# Patient Record
Sex: Female | Born: 1960 | Race: Black or African American | Hispanic: No | Marital: Single | Smoking: Former smoker
Health system: Southern US, Community
[De-identification: ages and names within clinical notes are randomized; demographics above are authoritative.]

## PROBLEM LIST (undated history)

## (undated) DIAGNOSIS — Z803 Family history of malignant neoplasm of breast: Secondary | ICD-10-CM

## (undated) DIAGNOSIS — Z800 Family history of malignant neoplasm of digestive organs: Secondary | ICD-10-CM

## (undated) DIAGNOSIS — K649 Unspecified hemorrhoids: Secondary | ICD-10-CM

## (undated) DIAGNOSIS — Z9221 Personal history of antineoplastic chemotherapy: Secondary | ICD-10-CM

## (undated) DIAGNOSIS — Z862 Personal history of diseases of the blood and blood-forming organs and certain disorders involving the immune mechanism: Secondary | ICD-10-CM

## (undated) DIAGNOSIS — C189 Malignant neoplasm of colon, unspecified: Secondary | ICD-10-CM

## (undated) HISTORY — PX: TUBAL LIGATION: SHX77

## (undated) HISTORY — PX: COLON SURGERY: SHX602

## (undated) HISTORY — DX: Family history of malignant neoplasm of breast: Z80.3

## (undated) HISTORY — DX: Family history of malignant neoplasm of digestive organs: Z80.0

---

## 1995-05-09 HISTORY — PX: TUBAL LIGATION: SHX77

## 1999-07-26 ENCOUNTER — Encounter: Admission: RE | Admit: 1999-07-26 | Discharge: 1999-07-26 | Payer: Self-pay | Admitting: Obstetrics & Gynecology

## 1999-07-26 ENCOUNTER — Other Ambulatory Visit: Admission: RE | Admit: 1999-07-26 | Discharge: 1999-07-26 | Payer: Self-pay | Admitting: *Deleted

## 2000-03-13 ENCOUNTER — Ambulatory Visit (HOSPITAL_COMMUNITY): Admission: RE | Admit: 2000-03-13 | Discharge: 2000-03-13 | Payer: Self-pay | Admitting: Obstetrics & Gynecology

## 2000-11-30 ENCOUNTER — Emergency Department (HOSPITAL_COMMUNITY): Admission: EM | Admit: 2000-11-30 | Discharge: 2000-11-30 | Payer: Self-pay | Admitting: Emergency Medicine

## 2000-11-30 ENCOUNTER — Encounter: Payer: Self-pay | Admitting: Emergency Medicine

## 2004-02-12 ENCOUNTER — Ambulatory Visit: Payer: Self-pay | Admitting: Family Medicine

## 2009-12-06 DIAGNOSIS — Z85038 Personal history of other malignant neoplasm of large intestine: Secondary | ICD-10-CM

## 2009-12-06 HISTORY — DX: Personal history of other malignant neoplasm of large intestine: Z85.038

## 2009-12-07 ENCOUNTER — Ambulatory Visit: Payer: Self-pay | Admitting: Cardiology

## 2009-12-07 ENCOUNTER — Ambulatory Visit (HOSPITAL_COMMUNITY): Admission: RE | Admit: 2009-12-07 | Discharge: 2009-12-07 | Payer: Self-pay | Admitting: Cardiology

## 2009-12-07 ENCOUNTER — Encounter: Admission: RE | Admit: 2009-12-07 | Discharge: 2009-12-07 | Payer: Self-pay | Admitting: Cardiology

## 2009-12-10 ENCOUNTER — Ambulatory Visit: Payer: Self-pay | Admitting: Internal Medicine

## 2009-12-10 ENCOUNTER — Ambulatory Visit: Payer: Self-pay | Admitting: Cardiology

## 2009-12-10 ENCOUNTER — Inpatient Hospital Stay (HOSPITAL_COMMUNITY): Admission: EM | Admit: 2009-12-10 | Discharge: 2009-12-20 | Payer: Self-pay | Admitting: Emergency Medicine

## 2009-12-13 ENCOUNTER — Encounter: Payer: Self-pay | Admitting: Internal Medicine

## 2009-12-14 ENCOUNTER — Encounter: Payer: Self-pay | Admitting: Internal Medicine

## 2009-12-14 HISTORY — PX: COLON SURGERY: SHX602

## 2009-12-15 ENCOUNTER — Ambulatory Visit: Payer: Self-pay | Admitting: Hematology and Oncology

## 2009-12-27 ENCOUNTER — Encounter: Payer: Self-pay | Admitting: Internal Medicine

## 2009-12-27 ENCOUNTER — Ambulatory Visit: Payer: Self-pay | Admitting: Internal Medicine

## 2009-12-27 DIAGNOSIS — C189 Malignant neoplasm of colon, unspecified: Secondary | ICD-10-CM

## 2009-12-27 DIAGNOSIS — D509 Iron deficiency anemia, unspecified: Secondary | ICD-10-CM

## 2009-12-29 LAB — CONVERTED CEMR LAB
HCT: 33.9 % — ABNORMAL LOW (ref 36.0–46.0)
Hemoglobin: 8.8 g/dL — ABNORMAL LOW (ref 12.0–15.0)
LDL Cholesterol: 104 mg/dL — ABNORMAL HIGH (ref 0–99)
MCV: 77.4 fL — ABNORMAL LOW (ref >=78.0)
Platelets: 1527 10*3/uL (ref 150–400)
RBC: 4.38 M/uL (ref 3.87–5.11)
RDW: 32.5 % — ABNORMAL HIGH (ref 11.5–15.5)
Total CHOL/HDL Ratio: 3.6
Triglycerides: 82 mg/dL (ref <150)
VLDL: 16 mg/dL (ref 0–40)

## 2010-01-03 ENCOUNTER — Ambulatory Visit (HOSPITAL_COMMUNITY): Admission: RE | Admit: 2010-01-03 | Discharge: 2010-01-03 | Payer: Self-pay | Admitting: Hematology and Oncology

## 2010-01-03 LAB — CBC WITH DIFFERENTIAL/PLATELET
BASO%: 0.3 % (ref 0.0–2.0)
EOS%: 0.9 % (ref 0.0–7.0)
LYMPH%: 26.4 % (ref 14.0–49.7)
MCH: 20.7 pg — ABNORMAL LOW (ref 25.1–34.0)
NEUT%: 64.8 % (ref 38.4–76.8)
RDW: 29.6 % — ABNORMAL HIGH (ref 11.2–14.5)
lymph#: 3.1 10*3/uL (ref 0.9–3.3)
nRBC: 0 % (ref 0–0)

## 2010-01-03 LAB — COMPREHENSIVE METABOLIC PANEL
ALT: 10 U/L (ref 0–35)
AST: 15 U/L (ref 0–37)
Alkaline Phosphatase: 40 U/L (ref 39–117)
Chloride: 105 mEq/L (ref 96–112)
Creatinine, Ser: 0.46 mg/dL (ref 0.40–1.20)
Potassium: 4.2 mEq/L (ref 3.5–5.3)
Total Protein: 7.5 g/dL (ref 6.0–8.3)

## 2010-01-03 LAB — CEA: CEA: 12.6 ng/mL — ABNORMAL HIGH (ref 0.0–5.0)

## 2010-01-05 ENCOUNTER — Encounter: Admission: RE | Admit: 2010-01-05 | Discharge: 2010-01-05 | Payer: Self-pay | Admitting: Hematology and Oncology

## 2010-01-05 LAB — IRON AND TIBC: UIBC: 268 ug/dL

## 2010-01-05 LAB — FERRITIN: Ferritin: 23 ng/mL (ref 10–291)

## 2010-01-06 ENCOUNTER — Ambulatory Visit (HOSPITAL_COMMUNITY): Admission: RE | Admit: 2010-01-06 | Discharge: 2010-01-06 | Payer: Self-pay | Admitting: Hematology and Oncology

## 2010-01-13 ENCOUNTER — Ambulatory Visit (HOSPITAL_BASED_OUTPATIENT_CLINIC_OR_DEPARTMENT_OTHER): Admission: RE | Admit: 2010-01-13 | Discharge: 2010-01-13 | Payer: Self-pay | Admitting: Surgery

## 2010-01-13 HISTORY — PX: PORTACATH PLACEMENT: SHX2246

## 2010-01-17 ENCOUNTER — Ambulatory Visit: Payer: Self-pay | Admitting: Hematology and Oncology

## 2010-02-01 LAB — CBC WITH DIFFERENTIAL/PLATELET
BASO%: 0.3 % (ref 0.0–2.0)
Basophils Absolute: 0 10*3/uL (ref 0.0–0.1)
HGB: 9.8 g/dL — ABNORMAL LOW (ref 11.6–15.9)
LYMPH%: 24.4 % (ref 14.0–49.7)
MONO#: 0.5 10*3/uL (ref 0.1–0.9)
NEUT%: 69.8 % (ref 38.4–76.8)
RDW: 24 % — ABNORMAL HIGH (ref 11.2–14.5)
lymph#: 2.7 10*3/uL (ref 0.9–3.3)
nRBC: 0 % (ref 0–0)

## 2010-02-01 LAB — COMPREHENSIVE METABOLIC PANEL
ALT: 8 U/L (ref 0–35)
Albumin: 3.7 g/dL (ref 3.5–5.2)
Alkaline Phosphatase: 56 U/L (ref 39–117)
BUN: 11 mg/dL (ref 6–23)
Calcium: 9.4 mg/dL (ref 8.4–10.5)
Chloride: 102 mEq/L (ref 96–112)
Creatinine, Ser: 0.64 mg/dL (ref 0.40–1.20)
Glucose, Bld: 86 mg/dL (ref 70–99)
Potassium: 4.4 mEq/L (ref 3.5–5.3)
Total Bilirubin: 0.2 mg/dL — ABNORMAL LOW (ref 0.3–1.2)
Total Protein: 6.9 g/dL (ref 6.0–8.3)

## 2010-02-15 LAB — CBC WITH DIFFERENTIAL/PLATELET
Eosinophils Absolute: 0.1 10*3/uL (ref 0.0–0.5)
HCT: 36.1 % (ref 34.8–46.6)
LYMPH%: 29.8 % (ref 14.0–49.7)
MCH: 23.2 pg — ABNORMAL LOW (ref 25.1–34.0)
MCHC: 29.6 g/dL — ABNORMAL LOW (ref 31.5–36.0)
MCV: 78.3 fL — ABNORMAL LOW (ref 79.5–101.0)
MONO#: 0.6 10*3/uL (ref 0.1–0.9)
MONO%: 6.2 % (ref 0.0–14.0)
NEUT%: 62.4 % (ref 38.4–76.8)
Platelets: 379 10*3/uL (ref 145–400)
RBC: 4.61 10*6/uL (ref 3.70–5.45)
RDW: 22.2 % — ABNORMAL HIGH (ref 11.2–14.5)
lymph#: 2.9 10*3/uL (ref 0.9–3.3)

## 2010-02-15 LAB — COMPREHENSIVE METABOLIC PANEL
ALT: <8 U/L (ref 0–35)
Albumin: 4 g/dL (ref 3.5–5.2)
Alkaline Phosphatase: 109 U/L (ref 39–117)
Calcium: 9.4 mg/dL (ref 8.4–10.5)
Glucose, Bld: 111 mg/dL — ABNORMAL HIGH (ref 70–99)
Sodium: 139 mEq/L (ref 135–145)
Total Bilirubin: 0.4 mg/dL (ref 0.3–1.2)
Total Protein: 7.1 g/dL (ref 6.0–8.3)

## 2010-02-16 ENCOUNTER — Ambulatory Visit: Payer: Self-pay | Admitting: Hematology and Oncology

## 2010-03-01 LAB — COMPREHENSIVE METABOLIC PANEL
AST: 14 U/L (ref 0–37)
Creatinine, Ser: 0.42 mg/dL (ref 0.40–1.20)
Glucose, Bld: 103 mg/dL — ABNORMAL HIGH (ref 70–99)
Sodium: 138 mEq/L (ref 135–145)
Total Bilirubin: 0.4 mg/dL (ref 0.3–1.2)
Total Protein: 7 g/dL (ref 6.0–8.3)

## 2010-03-01 LAB — CBC WITH DIFFERENTIAL/PLATELET
Eosinophils Absolute: 0.1 10*3/uL (ref 0.0–0.5)
HCT: 35.5 % (ref 34.8–46.6)
HGB: 10.8 g/dL — ABNORMAL LOW (ref 11.6–15.9)
MCV: 78.4 fL — ABNORMAL LOW (ref 79.5–101.0)
MONO%: 11.9 % (ref 0.0–14.0)
NEUT%: 59.2 % (ref 38.4–76.8)
Platelets: 552 10*3/uL — ABNORMAL HIGH (ref 145–400)
RBC: 4.53 10*6/uL (ref 3.70–5.45)
RDW: 21.8 % — ABNORMAL HIGH (ref 11.2–14.5)
WBC: 10.8 10*3/uL — ABNORMAL HIGH (ref 3.9–10.3)
lymph#: 3 10*3/uL (ref 0.9–3.3)

## 2010-03-16 LAB — COMPREHENSIVE METABOLIC PANEL
Albumin: 4 g/dL (ref 3.5–5.2)
Creatinine, Ser: 0.61 mg/dL (ref 0.40–1.20)

## 2010-03-16 LAB — CBC WITH DIFFERENTIAL/PLATELET
Basophils Absolute: 0.1 10*3/uL (ref 0.0–0.1)
Eosinophils Absolute: 0.1 10*3/uL (ref 0.0–0.5)
HCT: 36.7 % (ref 34.8–46.6)
MCH: 24.4 pg — ABNORMAL LOW (ref 25.1–34.0)
MCHC: 31.1 g/dL — ABNORMAL LOW (ref 31.5–36.0)
MONO#: 2.3 10*3/uL — ABNORMAL HIGH (ref 0.1–0.9)
MONO%: 17.7 % — ABNORMAL HIGH (ref 0.0–14.0)
Platelets: 400 10*3/uL (ref 145–400)
WBC: 12.8 10*3/uL — ABNORMAL HIGH (ref 3.9–10.3)
lymph#: 3 10*3/uL (ref 0.9–3.3)
nRBC: 0 % (ref 0–0)

## 2010-03-18 ENCOUNTER — Ambulatory Visit: Payer: Self-pay | Admitting: Hematology and Oncology

## 2010-03-29 LAB — CBC WITH DIFFERENTIAL/PLATELET
Basophils Absolute: 0.1 10*3/uL (ref 0.0–0.1)
Eosinophils Absolute: 0.2 10*3/uL (ref 0.0–0.5)
HGB: 10.9 g/dL — ABNORMAL LOW (ref 11.6–15.9)
MONO#: 1.4 10*3/uL — ABNORMAL HIGH (ref 0.1–0.9)
NEUT#: 5.6 10*3/uL (ref 1.5–6.5)
NEUT%: 50 % (ref 38.4–76.8)
Platelets: 414 10*3/uL — ABNORMAL HIGH (ref 145–400)
RDW: 21.2 % — ABNORMAL HIGH (ref 11.2–14.5)
WBC: 11.2 10*3/uL — ABNORMAL HIGH (ref 3.9–10.3)
lymph#: 4 10*3/uL — ABNORMAL HIGH (ref 0.9–3.3)

## 2010-03-29 LAB — COMPREHENSIVE METABOLIC PANEL
Albumin: 4 g/dL (ref 3.5–5.2)
Calcium: 9.4 mg/dL (ref 8.4–10.5)
Creatinine, Ser: 0.5 mg/dL (ref 0.40–1.20)
Total Protein: 6.8 g/dL (ref 6.0–8.3)

## 2010-04-12 LAB — CBC WITH DIFFERENTIAL/PLATELET
HCT: 30.6 % — ABNORMAL LOW (ref 34.8–46.6)
HGB: 9.8 g/dL — ABNORMAL LOW (ref 11.6–15.9)
LYMPH%: 43.9 % (ref 14.0–49.7)
MCHC: 32 g/dL (ref 31.5–36.0)
MONO#: 1.4 10*3/uL — ABNORMAL HIGH (ref 0.1–0.9)
RBC: 3.98 10*6/uL (ref 3.70–5.45)
RDW: 20.3 % — ABNORMAL HIGH (ref 11.2–14.5)
WBC: 4.9 10*3/uL (ref 3.9–10.3)
lymph#: 2.2 10*3/uL (ref 0.9–3.3)

## 2010-04-12 LAB — COMPREHENSIVE METABOLIC PANEL
ALT: 9 U/L (ref 0–35)
Albumin: 3.9 g/dL (ref 3.5–5.2)
Alkaline Phosphatase: 68 U/L (ref 39–117)
Calcium: 9.3 mg/dL (ref 8.4–10.5)
Chloride: 105 mEq/L (ref 96–112)
Creatinine, Ser: 0.41 mg/dL (ref 0.40–1.20)
Glucose, Bld: 88 mg/dL (ref 70–99)
Total Bilirubin: 0.9 mg/dL (ref 0.3–1.2)
Total Protein: 6.9 g/dL (ref 6.0–8.3)

## 2010-04-18 ENCOUNTER — Ambulatory Visit: Payer: Self-pay | Admitting: Hematology and Oncology

## 2010-04-20 LAB — CBC WITH DIFFERENTIAL/PLATELET
BASO%: 1.6 % (ref 0.0–2.0)
Basophils Absolute: 0.1 10*3/uL (ref 0.0–0.1)
EOS%: 3.4 % (ref 0.0–7.0)
HCT: 31.2 % — ABNORMAL LOW (ref 34.8–46.6)
LYMPH%: 49.1 % (ref 14.0–49.7)
MCHC: 31.1 g/dL — ABNORMAL LOW (ref 31.5–36.0)
MCV: 77.2 fL — ABNORMAL LOW (ref 79.5–101.0)
MONO#: 1.5 10*3/uL — ABNORMAL HIGH (ref 0.1–0.9)
MONO%: 26.5 % — ABNORMAL HIGH (ref 0.0–14.0)
NEUT#: 1.1 10*3/uL — ABNORMAL LOW (ref 1.5–6.5)
NEUT%: 19.4 % — ABNORMAL LOW (ref 38.4–76.8)
Platelets: 412 10*3/uL — ABNORMAL HIGH (ref 145–400)
RDW: 19.6 % — ABNORMAL HIGH (ref 11.2–14.5)
nRBC: 0 % (ref 0–0)

## 2010-04-21 LAB — CBC WITH DIFFERENTIAL/PLATELET
BASO%: 1.4 % (ref 0.0–2.0)
Eosinophils Absolute: 0.2 10*3/uL (ref 0.0–0.5)
HCT: 31.3 % — ABNORMAL LOW (ref 34.8–46.6)
MCH: 23.8 pg — ABNORMAL LOW (ref 25.1–34.0)
MCHC: 31.3 g/dL — ABNORMAL LOW (ref 31.5–36.0)
MONO#: 1.6 10*3/uL — ABNORMAL HIGH (ref 0.1–0.9)
MONO%: 27.9 % — ABNORMAL HIGH (ref 0.0–14.0)
NEUT#: 1 10*3/uL — ABNORMAL LOW (ref 1.5–6.5)
Platelets: 488 10*3/uL — ABNORMAL HIGH (ref 145–400)
RBC: 4.11 10*6/uL (ref 3.70–5.45)

## 2010-04-28 LAB — COMPREHENSIVE METABOLIC PANEL
ALT: 16 U/L (ref 0–35)
AST: 38 U/L — ABNORMAL HIGH (ref 0–37)
Albumin: 4 g/dL (ref 3.5–5.2)
Alkaline Phosphatase: 338 U/L — ABNORMAL HIGH (ref 39–117)
Chloride: 105 mEq/L (ref 96–112)
Potassium: 3.9 mEq/L (ref 3.5–5.3)
Sodium: 139 mEq/L (ref 135–145)
Total Bilirubin: 0.3 mg/dL (ref 0.3–1.2)
Total Protein: 7.4 g/dL (ref 6.0–8.3)

## 2010-04-28 LAB — CBC WITH DIFFERENTIAL/PLATELET
BASO%: 0.1 % (ref 0.0–2.0)
EOS%: 0.3 % (ref 0.0–7.0)
LYMPH%: 8.5 % — ABNORMAL LOW (ref 14.0–49.7)
MCV: 78.9 fL — ABNORMAL LOW (ref 79.5–101.0)
MONO%: 2.7 % (ref 0.0–14.0)
NEUT%: 88.4 % — ABNORMAL HIGH (ref 38.4–76.8)
RDW: 20.3 % — ABNORMAL HIGH (ref 11.2–14.5)
WBC: 67.5 10*3/uL (ref 3.9–10.3)

## 2010-05-10 LAB — CBC WITH DIFFERENTIAL/PLATELET
EOS%: 2 % (ref 0.0–7.0)
Eosinophils Absolute: 0.2 10*3/uL (ref 0.0–0.5)
HCT: 32.8 % — ABNORMAL LOW (ref 34.8–46.6)
MONO#: 1.2 10*3/uL — ABNORMAL HIGH (ref 0.1–0.9)
NEUT#: 4.2 10*3/uL (ref 1.5–6.5)
NEUT%: 52.7 % (ref 38.4–76.8)
nRBC: 1 % — ABNORMAL HIGH (ref 0–0)

## 2010-05-20 ENCOUNTER — Ambulatory Visit (HOSPITAL_BASED_OUTPATIENT_CLINIC_OR_DEPARTMENT_OTHER): Payer: BC Managed Care – PPO | Admitting: Hematology and Oncology

## 2010-05-24 LAB — MANUAL DIFFERENTIAL
ALC: 3.3 10*3/uL (ref 0.9–3.3)
ANC (CHCC manual diff): 3.7 10*3/uL (ref 1.5–6.5)
Band Neutrophils: 2 % (ref 0–10)
Basophil: 2 % (ref 0–2)
Blasts: 0 % (ref 0–0)
EOS: 1 % (ref 0–7)
LYMPH: 37 % (ref 14–49)
MONO: 19 % — ABNORMAL HIGH (ref 0–14)
Metamyelocytes: 0 % (ref 0–0)
Myelocytes: 0 % (ref 0–0)
Other Cell: 0 % (ref 0–0)
PLT EST: ADEQUATE
PROMYELO: 0 % (ref 0–0)
SEG: 39 % (ref 38–77)
Variant Lymph: 0 % (ref 0–0)
nRBC: 2 % — ABNORMAL HIGH (ref 0–0)

## 2010-05-24 LAB — COMPREHENSIVE METABOLIC PANEL WITH GFR
ALT: 12 U/L (ref 0–35)
AST: 22 U/L (ref 0–37)
Albumin: 4.1 g/dL (ref 3.5–5.2)
Alkaline Phosphatase: 158 U/L — ABNORMAL HIGH (ref 39–117)
BUN: 10 mg/dL (ref 6–23)
CO2: 26 meq/L (ref 19–32)
Calcium: 9.9 mg/dL (ref 8.4–10.5)
Chloride: 102 meq/L (ref 96–112)
Creatinine, Ser: 0.52 mg/dL (ref 0.40–1.20)
Glucose, Bld: 131 mg/dL — ABNORMAL HIGH (ref 70–99)
Potassium: 4.5 meq/L (ref 3.5–5.3)
Sodium: 138 meq/L (ref 135–145)
Total Bilirubin: 0.6 mg/dL (ref 0.3–1.2)
Total Protein: 7.4 g/dL (ref 6.0–8.3)

## 2010-05-24 LAB — CBC WITH DIFFERENTIAL/PLATELET
HCT: 34.7 % — ABNORMAL LOW (ref 34.8–46.6)
HGB: 10.8 g/dL — ABNORMAL LOW (ref 11.6–15.9)
MCH: 23.9 pg — ABNORMAL LOW (ref 25.1–34.0)
MCHC: 31.1 g/dL — ABNORMAL LOW (ref 31.5–36.0)
MCV: 76.9 fL — ABNORMAL LOW (ref 79.5–101.0)
Platelets: 456 10*3/uL — ABNORMAL HIGH (ref 145–400)
RBC: 4.51 10*6/uL (ref 3.70–5.45)
RDW: 22.7 % — ABNORMAL HIGH (ref 11.2–14.5)
WBC: 8.9 10*3/uL (ref 3.9–10.3)

## 2010-05-29 ENCOUNTER — Encounter: Payer: Self-pay | Admitting: Obstetrics and Gynecology

## 2010-06-07 DIAGNOSIS — R112 Nausea with vomiting, unspecified: Secondary | ICD-10-CM

## 2010-06-07 DIAGNOSIS — C189 Malignant neoplasm of colon, unspecified: Secondary | ICD-10-CM

## 2010-06-07 DIAGNOSIS — Z7901 Long term (current) use of anticoagulants: Secondary | ICD-10-CM

## 2010-06-07 LAB — CBC WITH DIFFERENTIAL/PLATELET
BASO%: 0.4 % (ref 0.0–2.0)
Basophils Absolute: 0.1 10*3/uL (ref 0.0–0.1)
HCT: 33.5 % — ABNORMAL LOW (ref 34.8–46.6)
HGB: 10.6 g/dL — ABNORMAL LOW (ref 11.6–15.9)
MCH: 24.4 pg — ABNORMAL LOW (ref 25.1–34.0)
MCHC: 31.6 g/dL (ref 31.5–36.0)
MONO#: 2.6 10*3/uL — ABNORMAL HIGH (ref 0.1–0.9)
MONO%: 21.2 % — ABNORMAL HIGH (ref 0.0–14.0)
NEUT#: 6.9 10*3/uL — ABNORMAL HIGH (ref 1.5–6.5)
RDW: 24.6 % — ABNORMAL HIGH (ref 11.2–14.5)
WBC: 12.2 10*3/uL — ABNORMAL HIGH (ref 3.9–10.3)
lymph#: 2.5 10*3/uL (ref 0.9–3.3)
nRBC: 1 % — ABNORMAL HIGH (ref 0–0)

## 2010-06-07 LAB — COMPREHENSIVE METABOLIC PANEL
ALT: 10 U/L (ref 0–35)
Alkaline Phosphatase: 158 U/L — ABNORMAL HIGH (ref 39–117)
CO2: 22 mEq/L (ref 19–32)
Potassium: 4 mEq/L (ref 3.5–5.3)
Sodium: 138 mEq/L (ref 135–145)

## 2010-06-07 NOTE — Consult Note (Signed)
Summary: REPORT OF SURGICAL   REPORT OF SURGICAL   Imported By: Margie Billet 01/05/2010 13:53:44  _____________________________________________________________________  External Attachment:    Type:   Image     Comment:   External Document

## 2010-06-07 NOTE — Letter (Signed)
Summary: [employer]  [employer]   Imported By: Margie Billet 01/26/2010 16:45:17  _____________________________________________________________________  External Attachment:    Type:   Image     Comment:   External Document

## 2010-06-07 NOTE — Assessment & Plan Note (Signed)
Summary: HFU/ESTABLISH AS NEW PT/PER MED STUDENT BRIANNA BUCKNER/DS   Vital Signs:  Patient profile:   50 year old female Height:      66 inches (167.64 cm) Weight:      111.4 pounds (50.64 kg) BMI:     18.05 Temp:     96.6 degrees F (35.89 degrees C) oral Pulse rate:   83 / minute BP sitting:   110 / 75  (left arm) Cuff size:   small  Vitals Entered By: Theotis Barrio NT II (December 27, 2009 8:44 AM) CC: PATIENT IS HERE FOR HOSPT FOLLOW UP APPT /  ABD PAIN   /  Is Patient Diabetic? No Pain Assessment Patient in pain? yes     Location: abdomen Intensity:     7 Type: sharp Onset of pain  SURGERY  /  AUGUST 011 Nutritional Status BMI of < 19 = underweight  Have you ever been in a relationship where you felt threatened, hurt or afraid?No   Does patient need assistance? Functional Status Self care Ambulation Normal Comments HOSPITAL FOLLOW UP APPT   CC:  PATIENT IS HERE FOR HOSPT FOLLOW UP APPT /  ABD PAIN   / .  History of Present Illness: Patient is here for hospital follow-up after surgical resection of colon cancer and severe iron-deficiency anemia.    Patient has follow-up appointments with: Oncology - Dr. Dorna Bloom Aug 26. Surgery - Dr. Gerrit Friends Aug 24 at 1215p  Pt has had bowel movements, no blood in her stool. No trouble taking iron. No other complaints.   No CP, SOB, dizziness, weakness.   Preventive Screening-Counseling & Management  Alcohol-Tobacco     Smoking Status: quit     Year Quit: 1997  Caffeine-Diet-Exercise     Does Patient Exercise: yes     Type of exercise: WALKING     Exercise (avg: min/session): 30-60     Times/week:     7  Current Problems (verified): 1)  Adenocarcinoma, Colon  (ICD-153.9) 2)  Preventive Health Care  (ICD-V70.0) 3)  Anemia, Iron Deficiency  (ICD-280.9)  Current Medications (verified): 1)  Ferrous Sulfate 325 (65 Fe) Mg Tabs (Ferrous Sulfate) .... Take 1 Tablet By Mouth Four Times A Day 2)  Hydrocodone-Acetaminophen 5-325  Mg Tabs (Hydrocodone-Acetaminophen) .... 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 BTL - 1997  Family History: mother - lung Ca father - unknown children - healthy No family history of colon cancer  Social History: Patient lives alone and with her 2 children (33 years old and 64 years old).  Patient is single. Sexually active with one partner, does not use condoms.   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 Former smoker - quit in 1997 Occasional alcohol (wine) No drugs.Smoking Status:  quit Does Patient Exercise:  yes  Review of Systems       see HPI  Physical Exam  General:  alert and NAD Mouth:  pharynx pink and moist.   Neck:  supple, full ROM, and no masses.   Lungs:  normal respiratory effort, normal breath sounds, no crackles, and no wheezes.   Heart:  normal rate, regular rhythm, and no murmur.   Abdomen:  soft  and normal bowel sounds.  TTP LUQ.  Pt with slightly tender midline incision with staples extending from epigastrum to approximately 4cm below navel.  No drainage, erythema.  Pulses:  2+ bilateral pedal pulses Extremities:  no edema Neurologic:  alert & oriented X3.  CN grossly intact, gait wnl. sensation to fine touch intact all 4 extremities Cervical Nodes:  no anterior cervical adenopathy.   Psych:  Oriented X3 and memory intact for recent and remote.  somewhat flat affect.   Impression & Recommendations:  Problem # 1:  ADENOCARCINOMA, COLON (ICD-153.9) Patient s/p resection of colon, spleen, and distal portion of pancreas on December 14, 2009.  Has f/u appts with surgery on Aug 24 for stable removal and  with oncology ([facility]) on aug 26.  Will await recommendations of oncology to see if further treatment for the patient's cancer will be considered.    Problem # 2:  ANEMIA, IRON DEFICIENCY (ICD-280.9) Pt is tolerating iron well.  On admission Hgb 3. Thought 2/2 blood loss from colon cancer.  Pt received 4uPRBCs during hospitalization.  On d/c Hgb 7.8.  Iron studies revealed negligible serum iron levels.  Too soon for recheck of serum iron or ferritin.  Reports no bleeding, no blood in stool.  Continue QID iron.  Check CBC today.  F/u in 3 months.  Her updated medication list for this problem includes:    Ferrous Sulfate 325 (65 Fe) Mg Tabs (Ferrous sulfate) .Marland Kitchen... 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  Received pneumovax and meningoccocal vaccine in hospital.  Is not certain if received tetanus vaccine.  Will administer tetanus today.  Pt had neg pap smear [facility] in July.  She had a mammogram scheduled for last week but missed that appointment.  She will need referral at her next visit for mammogram.  Pt is fasting today - will check FLP.  LFTs done in hospital were wnl.  Orders: T-Lipid Profile ([id number])  Complete Medication List: 1)  Ferrous Sulfate 325 (65 Fe) Mg Tabs (Ferrous sulfate) .... Take 1 tablet by mouth four times a day 2)  Hydrocodone-acetaminophen 5-325 Mg Tabs (Hydrocodone-acetaminophen) .... Take 1 tablet by mouth q6h as needed  Other Orders: Tdap => 46yrs IM ([id number]) Admin 1st Vaccine (13086)  Patient Instructions: 1)  Please finish your fluconazole- you have one more day of this medicine. 2)  Please take your iron as directed 3)  Please keep the appointments with your oncologist (cancer) and surgeon this week. 4)  Follow-up with me in 3 months, or sooner if needed. Process Orders Check  Orders Results:     [facility]: ABN not required for this insurance Tests Sent for requisitioning (December 27, 2009 11:42 AM):     12/27/2009: [facility] -- T-CBC No Diff [[id number]] (signed)     12/27/2009: [facility] -- T-Lipid Profile [phone] (signed)     Prevention & Chronic Care Immunizations   Influenza vaccine: Not documented    Tetanus booster: 12/27/2009: Tdap   Tetanus booster due: 12/28/2019    Pneumococcal vaccine: given at hospital after splenecomy, with meningococcal vaccine  (12/20/2009)   Pneumococcal vaccine deferral: Not indicated  (12/27/2009)   Pneumococcal vaccine due: 12/21/2014  Other Screening   Pap smear: Not documented   Pap smear action/deferral: Deferred  (12/27/2009)  Pap smear due: 12/28/2010    Mammogram: Not documented   Mammogram action/deferral: Deferred  (12/27/2009)   Smoking status: quit  (12/27/2009)  Lipids   Total Cholesterol: Not documented   LDL: Not documented   LDL Direct: Not documented   HDL: Not documented   Triglycerides: Not documented   Nursing Instructions: Give tetanus booster today..DTap vaccine given.Cynda Familia [facility])  December 27, 2009 9:58 AM     Immunization History:  Meningococcal Immunization History:    Meningococcal:  inpatient (12/20/2009)  Immunizations Administered:  Tetanus Vaccine:    Vaccine Type: Tdap    Site: left deltoid    Mfr: GlaxoSmithKline    Dose: 0.5 ml    Route: IM    Given by: Cynda Familia ([facility])    Exp. Date: 07/31/2011    Lot #: [id number]    VIS given: 03/26/07 version given December 27, 2009.      Appended Document: HFU/ESTABLISH AS NEW PT/PER MED STUDENT BRIANNA BUCKNER/DS I sawand discussed Ms Hojnacki with Dr Claudette Laws and I agree with her note. Ms Bodkins had [facility] forms to fill out for her job which Dr Claudette Laws did. She has a Heme/Onc F/U later this week. She is tolerating her Iron supplements and Dr  Claudette Laws will check a CBC today. It is too early to check another ferritin.

## 2010-06-07 NOTE — Miscellaneous (Signed)
Summary: PATIENT CONSENT FORM  PATIENT CONSENT FORM   Imported By: Louretta Parma 12/31/2009 15:04:05  _____________________________________________________________________  External Attachment:    Type:   Image     Comment:   External Document

## 2010-06-08 ENCOUNTER — Ambulatory Visit (HOSPITAL_BASED_OUTPATIENT_CLINIC_OR_DEPARTMENT_OTHER): Payer: BC Managed Care – PPO | Admitting: Hematology and Oncology

## 2010-06-08 DIAGNOSIS — Z5111 Encounter for antineoplastic chemotherapy: Secondary | ICD-10-CM

## 2010-06-08 DIAGNOSIS — C189 Malignant neoplasm of colon, unspecified: Secondary | ICD-10-CM

## 2010-06-10 ENCOUNTER — Encounter (HOSPITAL_BASED_OUTPATIENT_CLINIC_OR_DEPARTMENT_OTHER): Payer: BC Managed Care – PPO | Admitting: Hematology and Oncology

## 2010-06-10 DIAGNOSIS — C189 Malignant neoplasm of colon, unspecified: Secondary | ICD-10-CM

## 2010-06-10 DIAGNOSIS — Z5189 Encounter for other specified aftercare: Secondary | ICD-10-CM

## 2010-06-21 ENCOUNTER — Encounter (HOSPITAL_BASED_OUTPATIENT_CLINIC_OR_DEPARTMENT_OTHER): Payer: BC Managed Care – PPO | Admitting: Hematology and Oncology

## 2010-06-21 ENCOUNTER — Other Ambulatory Visit: Payer: Self-pay | Admitting: Hematology and Oncology

## 2010-06-21 DIAGNOSIS — C189 Malignant neoplasm of colon, unspecified: Secondary | ICD-10-CM

## 2010-06-21 DIAGNOSIS — R112 Nausea with vomiting, unspecified: Secondary | ICD-10-CM

## 2010-06-21 DIAGNOSIS — Z7901 Long term (current) use of anticoagulants: Secondary | ICD-10-CM

## 2010-06-21 LAB — CBC WITH DIFFERENTIAL/PLATELET
MCHC: 31 g/dL — ABNORMAL LOW (ref 31.5–36.0)
Platelets: 401 10*3/uL — ABNORMAL HIGH (ref 145–400)
RBC: 4.15 10*6/uL (ref 3.70–5.45)
WBC: 9.7 10*3/uL (ref 3.9–10.3)

## 2010-06-21 LAB — MANUAL DIFFERENTIAL
ALC: 3.7 10*3/uL — ABNORMAL HIGH (ref 0.9–3.3)
Band Neutrophils: 0 % (ref 0–10)
Basophil: 0 % (ref 0–2)
EOS: 0 % (ref 0–7)
Metamyelocytes: 0 % (ref 0–0)
PLT EST: INCREASED
PROMYELO: 0 % (ref 0–0)
Variant Lymph: 0 % (ref 0–0)

## 2010-06-21 LAB — COMPREHENSIVE METABOLIC PANEL
AST: 25 U/L (ref 0–37)
BUN: 14 mg/dL (ref 6–23)
CO2: 24 mEq/L (ref 19–32)
Chloride: 103 mEq/L (ref 96–112)
Glucose, Bld: 105 mg/dL — ABNORMAL HIGH (ref 70–99)
Total Bilirubin: 0.7 mg/dL (ref 0.3–1.2)
Total Protein: 7.1 g/dL (ref 6.0–8.3)

## 2010-06-22 ENCOUNTER — Encounter (HOSPITAL_BASED_OUTPATIENT_CLINIC_OR_DEPARTMENT_OTHER): Payer: BC Managed Care – PPO | Admitting: Hematology and Oncology

## 2010-06-22 DIAGNOSIS — C189 Malignant neoplasm of colon, unspecified: Secondary | ICD-10-CM

## 2010-06-22 DIAGNOSIS — Z5111 Encounter for antineoplastic chemotherapy: Secondary | ICD-10-CM

## 2010-06-24 ENCOUNTER — Encounter (HOSPITAL_BASED_OUTPATIENT_CLINIC_OR_DEPARTMENT_OTHER): Payer: BC Managed Care – PPO | Admitting: Hematology and Oncology

## 2010-06-24 DIAGNOSIS — Z5189 Encounter for other specified aftercare: Secondary | ICD-10-CM

## 2010-06-24 DIAGNOSIS — C189 Malignant neoplasm of colon, unspecified: Secondary | ICD-10-CM

## 2010-07-05 ENCOUNTER — Other Ambulatory Visit: Payer: Self-pay | Admitting: Hematology and Oncology

## 2010-07-05 ENCOUNTER — Encounter (HOSPITAL_BASED_OUTPATIENT_CLINIC_OR_DEPARTMENT_OTHER): Payer: BC Managed Care – PPO | Admitting: Hematology and Oncology

## 2010-07-05 DIAGNOSIS — R112 Nausea with vomiting, unspecified: Secondary | ICD-10-CM

## 2010-07-05 DIAGNOSIS — C189 Malignant neoplasm of colon, unspecified: Secondary | ICD-10-CM

## 2010-07-05 DIAGNOSIS — Z7901 Long term (current) use of anticoagulants: Secondary | ICD-10-CM

## 2010-07-05 LAB — CBC WITH DIFFERENTIAL/PLATELET
EOS%: 1.7 % (ref 0.0–7.0)
Eosinophils Absolute: 0.1 10*3/uL (ref 0.0–0.5)
HGB: 10.4 g/dL — ABNORMAL LOW (ref 11.6–15.9)
LYMPH%: 31.5 % (ref 14.0–49.7)
MCH: 24.7 pg — ABNORMAL LOW (ref 25.1–34.0)
MCHC: 31.2 g/dL — ABNORMAL LOW (ref 31.5–36.0)
MCV: 79.1 fL — ABNORMAL LOW (ref 79.5–101.0)
MONO#: 1.9 10*3/uL — ABNORMAL HIGH (ref 0.1–0.9)
MONO%: 23.1 % — ABNORMAL HIGH (ref 0.0–14.0)
NEUT#: 3.6 10*3/uL (ref 1.5–6.5)
NEUT%: 43.1 % (ref 38.4–76.8)
WBC: 8.3 10*3/uL (ref 3.9–10.3)
nRBC: 2 % — ABNORMAL HIGH (ref 0–0)

## 2010-07-05 LAB — COMPREHENSIVE METABOLIC PANEL
ALT: 14 U/L (ref 0–35)
Alkaline Phosphatase: 165 U/L — ABNORMAL HIGH (ref 39–117)
Calcium: 9.4 mg/dL (ref 8.4–10.5)
Creatinine, Ser: 0.49 mg/dL (ref 0.40–1.20)
Sodium: 139 mEq/L (ref 135–145)
Total Protein: 6.9 g/dL (ref 6.0–8.3)

## 2010-07-06 ENCOUNTER — Encounter (HOSPITAL_BASED_OUTPATIENT_CLINIC_OR_DEPARTMENT_OTHER): Payer: BC Managed Care – PPO | Admitting: Hematology and Oncology

## 2010-07-06 DIAGNOSIS — C189 Malignant neoplasm of colon, unspecified: Secondary | ICD-10-CM

## 2010-07-06 DIAGNOSIS — Z5111 Encounter for antineoplastic chemotherapy: Secondary | ICD-10-CM

## 2010-07-08 ENCOUNTER — Encounter (HOSPITAL_BASED_OUTPATIENT_CLINIC_OR_DEPARTMENT_OTHER): Payer: BC Managed Care – PPO | Admitting: Hematology and Oncology

## 2010-07-08 DIAGNOSIS — Z5189 Encounter for other specified aftercare: Secondary | ICD-10-CM

## 2010-07-08 DIAGNOSIS — C189 Malignant neoplasm of colon, unspecified: Secondary | ICD-10-CM

## 2010-07-19 ENCOUNTER — Other Ambulatory Visit: Payer: Self-pay | Admitting: Hematology and Oncology

## 2010-07-19 ENCOUNTER — Encounter (HOSPITAL_BASED_OUTPATIENT_CLINIC_OR_DEPARTMENT_OTHER): Payer: BC Managed Care – PPO | Admitting: Hematology and Oncology

## 2010-07-19 DIAGNOSIS — R112 Nausea with vomiting, unspecified: Secondary | ICD-10-CM

## 2010-07-19 DIAGNOSIS — C189 Malignant neoplasm of colon, unspecified: Secondary | ICD-10-CM

## 2010-07-19 DIAGNOSIS — Z7901 Long term (current) use of anticoagulants: Secondary | ICD-10-CM

## 2010-07-19 LAB — CBC WITH DIFFERENTIAL/PLATELET
BASO%: 0.3 % (ref 0.0–2.0)
Basophils Absolute: 0.1 10*3/uL (ref 0.0–0.1)
EOS%: 1 % (ref 0.0–7.0)
MCH: 25.3 pg (ref 25.1–34.0)
MONO#: 2.8 10*3/uL — ABNORMAL HIGH (ref 0.1–0.9)
NEUT#: 6.9 10*3/uL — ABNORMAL HIGH (ref 1.5–6.5)
NEUT%: 47.7 % (ref 38.4–76.8)
Platelets: 387 10*3/uL (ref 145–400)
RDW: 27.5 % — ABNORMAL HIGH (ref 11.2–14.5)
lymph#: 4.7 10*3/uL — ABNORMAL HIGH (ref 0.9–3.3)
nRBC: 2 % — ABNORMAL HIGH (ref 0–0)

## 2010-07-19 LAB — COMPREHENSIVE METABOLIC PANEL
ALT: 12 U/L (ref 0–35)
Alkaline Phosphatase: 169 U/L — ABNORMAL HIGH (ref 39–117)
CO2: 24 mEq/L (ref 19–32)
Calcium: 9.3 mg/dL (ref 8.4–10.5)
Chloride: 104 mEq/L (ref 96–112)
Glucose, Bld: 87 mg/dL (ref 70–99)
Potassium: 3.9 mEq/L (ref 3.5–5.3)
Total Protein: 6.8 g/dL (ref 6.0–8.3)

## 2010-07-20 ENCOUNTER — Encounter (HOSPITAL_BASED_OUTPATIENT_CLINIC_OR_DEPARTMENT_OTHER): Payer: BC Managed Care – PPO | Admitting: Hematology and Oncology

## 2010-07-20 DIAGNOSIS — Z5111 Encounter for antineoplastic chemotherapy: Secondary | ICD-10-CM

## 2010-07-20 DIAGNOSIS — C189 Malignant neoplasm of colon, unspecified: Secondary | ICD-10-CM

## 2010-07-21 ENCOUNTER — Other Ambulatory Visit: Payer: Self-pay | Admitting: Hematology and Oncology

## 2010-07-21 DIAGNOSIS — C189 Malignant neoplasm of colon, unspecified: Secondary | ICD-10-CM

## 2010-07-21 LAB — DIFFERENTIAL
Basophils Absolute: 0 10*3/uL (ref 0.0–0.1)
Eosinophils Absolute: 0 10*3/uL (ref 0.0–0.7)
Lymphocytes Relative: 8 % — ABNORMAL LOW (ref 12–46)
Monocytes Absolute: 1.9 10*3/uL — ABNORMAL HIGH (ref 0.1–1.0)
Neutrophils Relative %: 83 % — ABNORMAL HIGH (ref 43–77)

## 2010-07-21 LAB — CBC
Platelets: 513 10*3/uL — ABNORMAL HIGH (ref 150–400)
RDW: 28.3 % — ABNORMAL HIGH (ref 11.5–15.5)
WBC: 21 10*3/uL — ABNORMAL HIGH (ref 4.0–10.5)

## 2010-07-21 LAB — BASIC METABOLIC PANEL
BUN: 6 mg/dL (ref 6–23)
Calcium: 9 mg/dL (ref 8.4–10.5)
Chloride: 103 mEq/L (ref 96–112)
GFR calc Af Amer: >60 mL/min (ref >60)
GFR calc non Af Amer: >60 mL/min (ref >60)

## 2010-07-21 LAB — PROTIME-INR
INR: 1.1 (ref 0.00–1.49)
Prothrombin Time: 14.4 seconds (ref 11.6–15.2)

## 2010-07-22 ENCOUNTER — Encounter: Payer: BC Managed Care – PPO | Admitting: Hematology and Oncology

## 2010-07-22 LAB — DIFFERENTIAL
Basophils Absolute: 0 10*3/uL (ref 0.0–0.1)
Basophils Absolute: 0 10*3/uL (ref 0.0–0.1)
Basophils Absolute: 0 10*3/uL (ref 0.0–0.1)
Basophils Absolute: 0 10*3/uL (ref 0.0–0.1)
Basophils Relative: 0 % (ref 0–1)
Basophils Relative: 0 % (ref 0–1)
Basophils Relative: 0 % (ref 0–1)
Eosinophils Absolute: 0.1 10*3/uL (ref 0.0–0.7)
Eosinophils Absolute: 0.2 10*3/uL (ref 0.0–0.7)
Eosinophils Absolute: 0.2 10*3/uL (ref 0.0–0.7)
Eosinophils Relative: 0 % (ref 0–5)
Eosinophils Relative: 0 % (ref 0–5)
Eosinophils Relative: 1 % (ref 0–5)
Eosinophils Relative: 1 % (ref 0–5)
Eosinophils Relative: 1 % (ref 0–5)
Lymphocytes Relative: 4 % — ABNORMAL LOW (ref 12–46)
Lymphocytes Relative: 7 % — ABNORMAL LOW (ref 12–46)
Lymphocytes Relative: 8 % — ABNORMAL LOW (ref 12–46)
Lymphs Abs: 1 10*3/uL (ref 0.7–4.0)
Lymphs Abs: 1.3 10*3/uL (ref 0.7–4.0)
Lymphs Abs: 1.4 10*3/uL (ref 0.7–4.0)
Lymphs Abs: 2.1 10*3/uL (ref 0.7–4.0)
Monocytes Absolute: 0.6 10*3/uL (ref 0.1–1.0)
Monocytes Absolute: 1 10*3/uL (ref 0.1–1.0)
Monocytes Absolute: 1.1 10*3/uL — ABNORMAL HIGH (ref 0.1–1.0)
Monocytes Relative: 4 % (ref 3–12)
Monocytes Relative: 7 % (ref 3–12)
Monocytes Relative: 8 % (ref 3–12)
Monocytes Relative: 8 % (ref 3–12)
Neutro Abs: 12 10*3/uL — ABNORMAL HIGH (ref 1.7–7.7)
Neutro Abs: 17.9 10*3/uL — ABNORMAL HIGH (ref 1.7–7.7)
Neutro Abs: 7.3 10*3/uL (ref 1.7–7.7)
Neutro Abs: 8.5 10*3/uL — ABNORMAL HIGH (ref 1.7–7.7)
Neutrophils Relative %: 76 % (ref 43–77)
Neutrophils Relative %: 92 % — ABNORMAL HIGH (ref 43–77)

## 2010-07-22 LAB — CBC
HCT: 27.2 % — ABNORMAL LOW (ref 36.0–46.0)
HCT: 27.4 % — ABNORMAL LOW (ref 36.0–46.0)
HCT: 22.9 % — ABNORMAL LOW (ref 36.0–46.0)
HCT: 26.3 % — ABNORMAL LOW (ref 36.0–46.0)
HCT: 28.3 % — ABNORMAL LOW (ref 36.0–46.0)
HCT: 28.9 % — ABNORMAL LOW (ref 36.0–46.0)
HCT: 30.9 % — ABNORMAL LOW (ref 36.0–46.0)
HCT: 31.4 % — ABNORMAL LOW (ref 36.0–46.0)
Hemoglobin: 3 g/dL — CL (ref 12.0–15.0)
Hemoglobin: 6.3 g/dL — CL (ref 12.0–15.0)
Hemoglobin: 6.5 g/dL — CL (ref 12.0–15.0)
Hemoglobin: 7.4 g/dL — ABNORMAL LOW (ref 12.0–15.0)
Hemoglobin: 8 g/dL — ABNORMAL LOW (ref 12.0–15.0)
Hemoglobin: 8.2 g/dL — ABNORMAL LOW (ref 12.0–15.0)
Hemoglobin: 8.8 g/dL — ABNORMAL LOW (ref 12.0–15.0)
MCH: 19.7 pg — ABNORMAL LOW (ref 26.0–34.0)
MCH: 11.8 pg — ABNORMAL LOW (ref 26.0–34.0)
MCH: 17.6 pg — ABNORMAL LOW (ref 26.0–34.0)
MCH: 18.2 pg — ABNORMAL LOW (ref 26.0–34.0)
MCH: 20 pg — ABNORMAL LOW (ref 26.0–34.0)
MCH: 20.2 pg — ABNORMAL LOW (ref 26.0–34.0)
MCH: 20.4 pg — ABNORMAL LOW (ref 26.0–34.0)
MCHC: 27.6 g/dL — ABNORMAL LOW (ref 30.0–36.0)
MCHC: 28.5 g/dL — ABNORMAL LOW (ref 30.0–36.0)
MCHC: 21.7 g/dL — ABNORMAL LOW (ref 30.0–36.0)
MCHC: 26.9 g/dL — ABNORMAL LOW (ref 30.0–36.0)
MCHC: 27.7 g/dL — ABNORMAL LOW (ref 30.0–36.0)
MCHC: 27.8 g/dL — ABNORMAL LOW (ref 30.0–36.0)
MCHC: 27.9 g/dL — ABNORMAL LOW (ref 30.0–36.0)
MCHC: 28.2 g/dL — ABNORMAL LOW (ref 30.0–36.0)
MCV: 71 fL — ABNORMAL LOW (ref 78.0–100.0)
MCV: 64.1 fL — ABNORMAL LOW (ref 78.0–100.0)
MCV: 66.4 fL — ABNORMAL LOW (ref 78.0–100.0)
MCV: 71.9 fL — ABNORMAL LOW (ref 78.0–100.0)
MCV: 72.7 fL — ABNORMAL LOW (ref 78.0–100.0)
MCV: 73.5 fL — ABNORMAL LOW (ref 78.0–100.0)
MCV: 73.9 fL — ABNORMAL LOW (ref 78.0–100.0)
Platelets: 1059 10*3/uL (ref 150–400)
Platelets: 442 10*3/uL — ABNORMAL HIGH (ref 150–400)
Platelets: 442 10*3/uL — ABNORMAL HIGH (ref 150–400)
Platelets: 445 10*3/uL — ABNORMAL HIGH (ref 150–400)
Platelets: 504 10*3/uL — ABNORMAL HIGH (ref 150–400)
Platelets: 519 10*3/uL — ABNORMAL HIGH (ref 150–400)
Platelets: 643 10*3/uL — ABNORMAL HIGH (ref 150–400)
RBC: 3.14 MIL/uL — ABNORMAL LOW (ref 3.87–5.11)
RBC: 3.36 MIL/uL — ABNORMAL LOW (ref 3.87–5.11)
RBC: 3.57 MIL/uL — ABNORMAL LOW (ref 3.87–5.11)
RBC: 3.85 MIL/uL — ABNORMAL LOW (ref 3.87–5.11)
RBC: 3.98 MIL/uL (ref 3.87–5.11)
RBC: 4.22 MIL/uL (ref 3.87–5.11)
RBC: 4.37 MIL/uL (ref 3.87–5.11)
RDW: 23.6 % — ABNORMAL HIGH (ref 11.5–15.5)
RDW: 31.4 % — ABNORMAL HIGH (ref 11.5–15.5)
RDW: 31.5 % — ABNORMAL HIGH (ref 11.5–15.5)
RDW: 32 % — ABNORMAL HIGH (ref 11.5–15.5)
WBC: 12.1 10*3/uL — ABNORMAL HIGH (ref 4.0–10.5)
WBC: 10.1 10*3/uL (ref 4.0–10.5)
WBC: 11.1 10*3/uL — ABNORMAL HIGH (ref 4.0–10.5)
WBC: 11.8 10*3/uL — ABNORMAL HIGH (ref 4.0–10.5)
WBC: 11.9 10*3/uL — ABNORMAL HIGH (ref 4.0–10.5)
WBC: 14.3 10*3/uL — ABNORMAL HIGH (ref 4.0–10.5)
WBC: 21.1 10*3/uL — ABNORMAL HIGH (ref 4.0–10.5)
WBC: 23.9 10*3/uL — ABNORMAL HIGH (ref 4.0–10.5)
WBC: 9.4 10*3/uL (ref 4.0–10.5)

## 2010-07-22 LAB — BASIC METABOLIC PANEL
BUN: 3 mg/dL — ABNORMAL LOW (ref 6–23)
BUN: 1 mg/dL — ABNORMAL LOW (ref 6–23)
BUN: 3 mg/dL — ABNORMAL LOW (ref 6–23)
BUN: 3 mg/dL — ABNORMAL LOW (ref 6–23)
BUN: 3 mg/dL — ABNORMAL LOW (ref 6–23)
CO2: 29 mEq/L (ref 19–32)
CO2: 25 mEq/L (ref 19–32)
CO2: 26 mEq/L (ref 19–32)
CO2: 28 mEq/L (ref 19–32)
CO2: 28 mEq/L (ref 19–32)
Calcium: 8.8 mg/dL (ref 8.4–10.5)
Calcium: 8 mg/dL — ABNORMAL LOW (ref 8.4–10.5)
Calcium: 8.2 mg/dL — ABNORMAL LOW (ref 8.4–10.5)
Calcium: 8.4 mg/dL (ref 8.4–10.5)
Calcium: 8.5 mg/dL (ref 8.4–10.5)
Calcium: 8.6 mg/dL (ref 8.4–10.5)
Chloride: 103 mEq/L (ref 96–112)
Chloride: 104 mEq/L (ref 96–112)
Chloride: 106 mEq/L (ref 96–112)
Chloride: 107 mEq/L (ref 96–112)
Creatinine, Ser: 0.34 mg/dL — ABNORMAL LOW (ref 0.4–1.2)
Creatinine, Ser: 0.42 mg/dL (ref 0.4–1.2)
Creatinine, Ser: 0.48 mg/dL (ref 0.4–1.2)
Creatinine, Ser: 0.48 mg/dL (ref 0.4–1.2)
Creatinine, Ser: 0.56 mg/dL (ref 0.4–1.2)
GFR calc Af Amer: >60 mL/min (ref >60)
GFR calc Af Amer: >60 mL/min (ref >60)
GFR calc Af Amer: >60 mL/min (ref >60)
GFR calc Af Amer: >60 mL/min (ref >60)
GFR calc Af Amer: >60 mL/min (ref >60)
GFR calc non Af Amer: >60 mL/min (ref >60)
GFR calc non Af Amer: >60 mL/min (ref >60)
GFR calc non Af Amer: >60 mL/min (ref >60)
GFR calc non Af Amer: >60 mL/min (ref >60)
GFR calc non Af Amer: >60 mL/min (ref >60)
Glucose, Bld: 108 mg/dL — ABNORMAL HIGH (ref 70–99)
Glucose, Bld: 102 mg/dL — ABNORMAL HIGH (ref 70–99)
Glucose, Bld: 119 mg/dL — ABNORMAL HIGH (ref 70–99)
Glucose, Bld: 77 mg/dL (ref 70–99)
Glucose, Bld: 79 mg/dL (ref 70–99)
Potassium: 3.6 mEq/L (ref 3.5–5.1)
Potassium: 3.2 mEq/L — ABNORMAL LOW (ref 3.5–5.1)
Potassium: 3.4 mEq/L — ABNORMAL LOW (ref 3.5–5.1)
Potassium: 3.7 mEq/L (ref 3.5–5.1)
Potassium: 4.1 mEq/L (ref 3.5–5.1)
Sodium: 136 mEq/L (ref 135–145)
Sodium: 137 mEq/L (ref 135–145)
Sodium: 137 mEq/L (ref 135–145)
Sodium: 140 mEq/L (ref 135–145)
Sodium: 140 mEq/L (ref 135–145)
Sodium: 140 mEq/L (ref 135–145)

## 2010-07-22 LAB — TYPE AND SCREEN
ABO/RH(D): O POS
Antibody Screen: NEGATIVE

## 2010-07-22 LAB — CROSSMATCH
ABO/RH(D): O POS
Antibody Screen: NEGATIVE

## 2010-07-22 LAB — LACTATE DEHYDROGENASE: LDH: 137 U/L (ref 94–250)

## 2010-07-22 LAB — PREALBUMIN: Prealbumin: 7.4 mg/dL — ABNORMAL LOW (ref 18.0–45.0)

## 2010-07-22 LAB — IRON AND TIBC

## 2010-07-22 LAB — RAPID URINE DRUG SCREEN, HOSP PERFORMED
Benzodiazepines: NOT DETECTED
Cocaine: NOT DETECTED
Tetrahydrocannabinol: NOT DETECTED

## 2010-07-22 LAB — FOLATE: Folate: 7.2 ng/mL

## 2010-07-22 LAB — HEPATIC FUNCTION PANEL
Bilirubin, Direct: 0.1 mg/dL (ref 0.0–0.3)
Indirect Bilirubin: 0.7 mg/dL (ref 0.3–0.9)
Total Protein: 6.6 g/dL (ref 6.0–8.3)

## 2010-07-22 LAB — HIV ANTIBODY (ROUTINE TESTING W REFLEX): HIV: NONREACTIVE

## 2010-07-22 LAB — FERRITIN: Ferritin: 3 ng/mL — ABNORMAL LOW (ref 10–291)

## 2010-07-22 LAB — HEMOGLOBIN A1C

## 2010-07-22 LAB — GLUCOSE, CAPILLARY: Glucose-Capillary: 153 mg/dL — ABNORMAL HIGH (ref 70–99)

## 2010-07-22 LAB — PREPARE RBC (CROSSMATCH)

## 2010-08-17 ENCOUNTER — Other Ambulatory Visit: Payer: Self-pay | Admitting: Hematology and Oncology

## 2010-08-17 ENCOUNTER — Encounter (HOSPITAL_COMMUNITY): Payer: Self-pay

## 2010-08-17 ENCOUNTER — Ambulatory Visit (HOSPITAL_COMMUNITY)
Admission: RE | Admit: 2010-08-17 | Discharge: 2010-08-17 | Disposition: A | Discharge status: Home / Self Care | Payer: BC Managed Care – PPO | Source: Ambulatory Visit | Attending: Hematology and Oncology | Admitting: Hematology and Oncology

## 2010-08-17 ENCOUNTER — Encounter (HOSPITAL_BASED_OUTPATIENT_CLINIC_OR_DEPARTMENT_OTHER): Payer: BC Managed Care – PPO | Admitting: Hematology and Oncology

## 2010-08-17 DIAGNOSIS — C189 Malignant neoplasm of colon, unspecified: Secondary | ICD-10-CM

## 2010-08-17 DIAGNOSIS — Z9089 Acquired absence of other organs: Secondary | ICD-10-CM | POA: Insufficient documentation

## 2010-08-17 DIAGNOSIS — Z5189 Encounter for other specified aftercare: Secondary | ICD-10-CM

## 2010-08-17 DIAGNOSIS — Z7901 Long term (current) use of anticoagulants: Secondary | ICD-10-CM

## 2010-08-17 DIAGNOSIS — R112 Nausea with vomiting, unspecified: Secondary | ICD-10-CM

## 2010-08-17 HISTORY — DX: Malignant neoplasm of colon, unspecified: C18.9

## 2010-08-17 LAB — CBC WITH DIFFERENTIAL/PLATELET
BASO%: 0.7 % (ref 0.0–2.0)
Basophils Absolute: 0.1 10*3/uL (ref 0.0–0.1)
EOS%: 1 % (ref 0.0–7.0)
HCT: 32.8 % — ABNORMAL LOW (ref 34.8–46.6)
HGB: 10.3 g/dL — ABNORMAL LOW (ref 11.6–15.9)
LYMPH%: 24.5 % (ref 14.0–49.7)
MCH: 25.5 pg (ref 25.1–34.0)
MCHC: 31.4 g/dL — ABNORMAL LOW (ref 31.5–36.0)
MONO#: 1.7 10*3/uL — ABNORMAL HIGH (ref 0.1–0.9)
NEUT%: 55.5 % (ref 38.4–76.8)
Platelets: 384 10*3/uL (ref 145–400)

## 2010-08-17 LAB — COMPREHENSIVE METABOLIC PANEL
AST: 26 U/L (ref 0–37)
Albumin: 3.9 g/dL (ref 3.5–5.2)
BUN: 15 mg/dL (ref 6–23)
Calcium: 9.3 mg/dL (ref 8.4–10.5)
Chloride: 106 mEq/L (ref 96–112)
Creatinine, Ser: 0.49 mg/dL (ref 0.40–1.20)
Glucose, Bld: 105 mg/dL — ABNORMAL HIGH (ref 70–99)

## 2010-08-17 MED ORDER — IOHEXOL 300 MG/ML  SOLN
100.0000 mL | Freq: Once | INTRAMUSCULAR | Status: AC | PRN
  Administered 2010-08-17: 100 mL via INTRAVENOUS

## 2010-08-19 ENCOUNTER — Encounter (HOSPITAL_BASED_OUTPATIENT_CLINIC_OR_DEPARTMENT_OTHER): Payer: BC Managed Care – PPO | Admitting: Hematology and Oncology

## 2010-08-19 DIAGNOSIS — C189 Malignant neoplasm of colon, unspecified: Secondary | ICD-10-CM

## 2010-08-19 DIAGNOSIS — Z5111 Encounter for antineoplastic chemotherapy: Secondary | ICD-10-CM

## 2010-08-19 DIAGNOSIS — R112 Nausea with vomiting, unspecified: Secondary | ICD-10-CM

## 2010-08-19 DIAGNOSIS — Z5189 Encounter for other specified aftercare: Secondary | ICD-10-CM

## 2010-08-31 ENCOUNTER — Encounter: Payer: BC Managed Care – PPO | Admitting: Hematology and Oncology

## 2010-10-04 ENCOUNTER — Ambulatory Visit (INDEPENDENT_AMBULATORY_CARE_PROVIDER_SITE_OTHER): Payer: BC Managed Care – PPO | Admitting: Internal Medicine

## 2010-10-04 ENCOUNTER — Encounter: Payer: Self-pay | Admitting: Internal Medicine

## 2010-10-04 VITALS — BP 126/79 | HR 70 | Temp 97.8°F | Ht 66.0 in | Wt 141.0 lb

## 2010-10-04 DIAGNOSIS — Z0000 Encounter for general adult medical examination without abnormal findings: Secondary | ICD-10-CM | POA: Insufficient documentation

## 2010-10-04 DIAGNOSIS — C189 Malignant neoplasm of colon, unspecified: Secondary | ICD-10-CM

## 2010-10-04 DIAGNOSIS — D509 Iron deficiency anemia, unspecified: Secondary | ICD-10-CM

## 2010-10-04 LAB — FERRITIN: Ferritin: 8 ng/mL — ABNORMAL LOW (ref 10–291)

## 2010-10-04 NOTE — Progress Notes (Signed)
  Subjective:    Patient ID: Diane Erickson, female    DOB: 06/02/1960, 50 y.o.   MRN: [id number]  HPI Comments: This 50 year old woman with history of colon cancer status post resection. She is now off of all chemotherapy is going monthly to get her catheter flushed. She is a repeat colonoscopy in August. She states she's on any medications including her iron. Her labs are per her oncologist report to her have all been great. Today she comes in without any complaints except for some stated residual skin changes from chemotherapy also some mild hair loss and decreased appetite. She states however she's her to keep a stable weight.     Review of Systems  Constitutional: Negative for fever, chills and diaphoresis.       Some decreased appetite since chemotherapy  HENT: Negative for congestion, sore throat and trouble swallowing.   Eyes: Negative for visual disturbance.  Respiratory: Negative for cough, chest tightness and shortness of breath.   Cardiovascular: Negative for chest pain, palpitations and leg swelling.  Gastrointestinal: Negative for nausea, vomiting, abdominal pain, diarrhea, constipation and blood in stool.  Genitourinary: Negative for dysuria, urgency, frequency and difficulty urinating.  Musculoskeletal: Negative for myalgias and arthralgias.  Skin: Negative for rash.  Neurological: Negative for dizziness, weakness, numbness and headaches.  Hematological: Negative for adenopathy.  Psychiatric/Behavioral: Negative for behavioral problems, confusion and dysphoric mood.  All other systems reviewed and are negative.       Objective:   Physical Exam  Constitutional: She is oriented to person, place, and time. She appears well-developed and well-nourished. No distress.  HENT:  Head: Normocephalic and atraumatic.  Mouth/Throat: No oropharyngeal exudate.  Eyes: Conjunctivae and EOM are normal. Pupils are equal, round, and reactive to light.  Neck: Normal range of motion. Neck  supple.  Cardiovascular: Normal rate, regular rhythm and intact distal pulses.   No murmur heard. Pulmonary/Chest: Effort normal and breath sounds normal. She has no wheezes.  Abdominal: Soft. Bowel sounds are normal. She exhibits no distension and no mass. There is no tenderness. There is no rebound and no guarding.  Musculoskeletal: Normal range of motion. She exhibits no edema.  Neurological: She is alert and oriented to person, place, and time. No cranial nerve deficit. Coordination normal.  Skin: Skin is warm and dry.       Some mottled skin darkening of upper extremities since chemotherapy - stable per patient  Psychiatric: She has a normal mood and affect. Her behavior is normal. Judgment and thought content normal.          Assessment & Plan:

## 2010-10-04 NOTE — Assessment & Plan Note (Signed)
Patient has regular followup with her oncologist for this including an August colonoscopy.  Her last labs in April including a CBC and CMP showed some anemia but are otherwise normal.

## 2010-10-04 NOTE — Patient Instructions (Signed)
Please return in 2-3 months for a Pap smear. Otherwise as needed or in 1 year for your regular physical. We will check your blood levels of iron today and call you if you need to restart your iron supplementation.  Otherwise, you have had recent labwork done by your oncologist and it looked stable. Your next mammogram will need to occur when you are 52.

## 2010-10-04 NOTE — Assessment & Plan Note (Addendum)
The patient is due for her next mammogram at the age of 89 she has no family history of breast cancer. She is due for a Pap smear in July or August and she will return to see [place] in 2-3 months that she can just have that done. Otherwise she will return to see [place] in one year. Patient is up-to-date on her immunizations. She received her tetanus shot we saw her earlier this year.  Also, given that the patient has had her spleen removed as part of her colon surgery, is very important to guard against infection. I phoned the patient after the clinic visit to explain to her that she will need to pick up the prescription for Augmentin I'm sending to her pharmacy. She also needed to purchase a thermometer so that she can check her temperature if she ever feels ill. I gave her instructions that if her temperature was ever greater than 100.5 but she should immediately take the antibiotic and call the clinic or go to the emergency room. These instructions will need to be given to her at each subsequent clinic visits until she knows them well. Her Augmentin that she picks up from the pharmacy should be good for about a year and a half or 2 so she should not need deep new post for that period time.

## 2010-10-04 NOTE — Assessment & Plan Note (Addendum)
Today we will check level of ferritin to see the patient is to resume her iron supplementation.  This is an addendum:  The patient's ferritin level still came back extremely low. I phoned the patient to inform her that she will need to resume her iron supplementation at least for the next 6 months at which time we can recheck her blood counts and ferritin levels.

## 2010-10-05 MED ORDER — FERROUS SULFATE 325 (65 FE) MG PO TABS: 325.0000 mg | ORAL_TABLET | Freq: Three times a day (TID) | ORAL | Status: DC

## 2010-10-05 MED ORDER — AMOXICILLIN-POT CLAVULANATE 875-125 MG PO TABS: 1.0000 | ORAL_TABLET | Freq: Two times a day (BID) | ORAL | Status: AC

## 2010-10-05 NOTE — Progress Notes (Signed)
Addended by: Adriana Reams on: 10/05/2010 08:17 AM   Modules accepted: Orders

## 2010-10-12 ENCOUNTER — Encounter (HOSPITAL_BASED_OUTPATIENT_CLINIC_OR_DEPARTMENT_OTHER): Payer: BC Managed Care – PPO | Admitting: Hematology and Oncology

## 2010-10-12 DIAGNOSIS — Z452 Encounter for adjustment and management of vascular access device: Secondary | ICD-10-CM

## 2010-10-12 DIAGNOSIS — C189 Malignant neoplasm of colon, unspecified: Secondary | ICD-10-CM

## 2010-11-23 ENCOUNTER — Encounter (HOSPITAL_BASED_OUTPATIENT_CLINIC_OR_DEPARTMENT_OTHER): Payer: BC Managed Care – PPO | Admitting: Hematology and Oncology

## 2010-11-23 DIAGNOSIS — Z452 Encounter for adjustment and management of vascular access device: Secondary | ICD-10-CM

## 2010-11-23 DIAGNOSIS — C189 Malignant neoplasm of colon, unspecified: Secondary | ICD-10-CM

## 2010-11-26 ENCOUNTER — Encounter: Payer: Self-pay | Admitting: Internal Medicine

## 2010-12-21 ENCOUNTER — Other Ambulatory Visit: Payer: Self-pay | Admitting: Hematology and Oncology

## 2010-12-21 ENCOUNTER — Encounter (HOSPITAL_BASED_OUTPATIENT_CLINIC_OR_DEPARTMENT_OTHER): Payer: BC Managed Care – PPO | Admitting: Hematology and Oncology

## 2010-12-21 DIAGNOSIS — Z452 Encounter for adjustment and management of vascular access device: Secondary | ICD-10-CM

## 2010-12-21 DIAGNOSIS — C189 Malignant neoplasm of colon, unspecified: Secondary | ICD-10-CM

## 2010-12-21 DIAGNOSIS — Z5111 Encounter for antineoplastic chemotherapy: Secondary | ICD-10-CM

## 2010-12-21 DIAGNOSIS — Z5189 Encounter for other specified aftercare: Secondary | ICD-10-CM

## 2010-12-21 LAB — CBC WITH DIFFERENTIAL/PLATELET
BASO%: 0.4 % (ref 0.0–2.0)
Eosinophils Absolute: 0 10*3/uL (ref 0.0–0.5)
MCHC: 31.8 g/dL (ref 31.5–36.0)
MONO#: 0.5 10*3/uL (ref 0.1–0.9)
NEUT#: 2.2 10*3/uL (ref 1.5–6.5)
Platelets: 292 10*3/uL (ref 145–400)
RBC: 4.46 10*6/uL (ref 3.70–5.45)
RDW: 21.3 % — ABNORMAL HIGH (ref 11.2–14.5)
WBC: 4.8 10*3/uL (ref 3.9–10.3)
lymph#: 2 10*3/uL (ref 0.9–3.3)
nRBC: 0 % (ref 0–0)

## 2010-12-21 LAB — COMPREHENSIVE METABOLIC PANEL
ALT: 11 U/L (ref 0–35)
AST: 20 U/L (ref 0–37)
CO2: 22 mEq/L (ref 19–32)
Calcium: 9.2 mg/dL (ref 8.4–10.5)
Chloride: 107 mEq/L (ref 96–112)
Creatinine, Ser: 0.51 mg/dL (ref 0.50–1.10)
Potassium: 4.1 mEq/L (ref 3.5–5.3)
Sodium: 137 mEq/L (ref 135–145)
Total Protein: 7.4 g/dL (ref 6.0–8.3)

## 2010-12-21 LAB — CEA: CEA: 3.3 ng/mL (ref 0.0–5.0)

## 2010-12-29 ENCOUNTER — Encounter: Payer: BC Managed Care – PPO | Admitting: Internal Medicine

## 2011-01-04 ENCOUNTER — Encounter (HOSPITAL_BASED_OUTPATIENT_CLINIC_OR_DEPARTMENT_OTHER): Payer: BC Managed Care – PPO | Admitting: Hematology and Oncology

## 2011-01-04 DIAGNOSIS — C189 Malignant neoplasm of colon, unspecified: Secondary | ICD-10-CM

## 2011-01-04 DIAGNOSIS — Z452 Encounter for adjustment and management of vascular access device: Secondary | ICD-10-CM

## 2011-01-12 ENCOUNTER — Encounter: Payer: BC Managed Care – PPO | Admitting: Internal Medicine

## 2011-01-26 ENCOUNTER — Encounter: Payer: BC Managed Care – PPO | Admitting: Internal Medicine

## 2011-03-01 ENCOUNTER — Encounter (HOSPITAL_BASED_OUTPATIENT_CLINIC_OR_DEPARTMENT_OTHER): Payer: BC Managed Care – PPO | Admitting: Hematology and Oncology

## 2011-03-01 DIAGNOSIS — C189 Malignant neoplasm of colon, unspecified: Secondary | ICD-10-CM

## 2011-03-01 DIAGNOSIS — Z452 Encounter for adjustment and management of vascular access device: Secondary | ICD-10-CM

## 2011-04-13 ENCOUNTER — Other Ambulatory Visit (HOSPITAL_COMMUNITY)
Admission: RE | Admit: 2011-04-13 | Discharge: 2011-04-13 | Disposition: A | Discharge status: Home / Self Care | Payer: BC Managed Care – PPO | Source: Ambulatory Visit | Attending: Internal Medicine | Admitting: Internal Medicine

## 2011-04-13 ENCOUNTER — Ambulatory Visit (INDEPENDENT_AMBULATORY_CARE_PROVIDER_SITE_OTHER): Payer: BC Managed Care – PPO | Admitting: Internal Medicine

## 2011-04-13 ENCOUNTER — Encounter: Payer: Self-pay | Admitting: Internal Medicine

## 2011-04-13 VITALS — BP 121/79 | HR 71 | Temp 97.2°F | Ht 66.0 in | Wt 153.6 lb

## 2011-04-13 DIAGNOSIS — Z01419 Encounter for gynecological examination (general) (routine) without abnormal findings: Secondary | ICD-10-CM | POA: Insufficient documentation

## 2011-04-13 DIAGNOSIS — C189 Malignant neoplasm of colon, unspecified: Secondary | ICD-10-CM

## 2011-04-13 DIAGNOSIS — Z0000 Encounter for general adult medical examination without abnormal findings: Secondary | ICD-10-CM

## 2011-04-13 DIAGNOSIS — Z124 Encounter for screening for malignant neoplasm of cervix: Secondary | ICD-10-CM | POA: Insufficient documentation

## 2011-04-13 LAB — LIPID PANEL
HDL: 78 mg/dL (ref >39)
LDL Cholesterol: 124 mg/dL — ABNORMAL HIGH (ref 0–99)

## 2011-04-13 NOTE — Assessment & Plan Note (Signed)
Stable. Patient has regular followup with her oncologist monthly and she will see her oncologist on December 14.

## 2011-04-13 NOTE — Patient Instructions (Signed)
1. Will call you for GYN consult 2. Please follow up with your oncologist on Dec,14th.

## 2011-04-13 NOTE — Assessment & Plan Note (Signed)
Patient is due for Pap smear this year and states that she has some pain during sexual intercourse. Denies vaginal spotting or bleeding after sexual intercourse. Pap smear performed today and specimen for GC/Chlamydia and wet prep sent. -We'll arrange GYN consult -Will check urinalysis as well

## 2011-04-13 NOTE — Progress Notes (Signed)
Subjective:    Patient ID: Diane Erickson, female    DOB: April 16, 1961, 50 y.o.   MRN: [id number]  HPI This is a 50 year old female with PMH of iron deficiency anemia and colon cancer who presents to the clinic for followup visit. Patient reports that she has had surgery for colon cancer in 2011, and she is now off of all chemotherapy.  She states that she follows up with her oncologist monthly to get her Port-A-Cath flushed. She states that she doesn't take any medications now. Her labs are per her oncologist report to her have been great.   Patient states that she has had chronic mild abdominal achiness for a couple months, which was evaluated by her oncologist.  She reports that she will have pelvic ultrasound and abdominal CAT scan in December 14, which are ordered by her oncologist. Patient is encouraged to go to above examinations and continue to followup with her oncologist.  Patient also reports pain with Sexual intercourse. No vaginal spoting or bleeding noted. Denies any vaginal discharge or foul ordor.  Her health maintenance are as follows. #1.she will have Pap smear today. #2.patient declined flu shot despite reinforcement of the risk of not getting flu shot in the setting of her splenectomy. #3. her mammogram is up-to-date #4.Will get her lipid panel today #5.patient states that she has had pneumococcal vaccine in 2011.   Review of Systems  No headache, fever, or sore throat. No shortness of breath or dyspnea on exertion. No chest pain, chest pressure or palpitation No nausea, vomiting, or abdominal pain. No melena, diarrhea or incontinence. No muscle weakness.                   Denies depression. No appetite or weight changes.   Past Medical History  Diagnosis Date  . Colon cancer     colon ca dx 8/11   Past Surgical History  Procedure Date  . Colon surgery     left colectomy, distal pancreatectomy, splenectomy  . Tubal ligation     1997   History   Social History   . Marital Status: Single    Spouse Name: N/A    Number of Children: N/A  . Years of Education: N/A   Occupational History  .      works at [employer] as a Production assistant, radio in Health and safety inspector   Social History Main Topics  . Smoking status: Former Games developer  . Smokeless tobacco: Not on file  . Alcohol Use: Yes     occasional alcohol, wine  . Drug Use: No  . Sexually Active: Not on file   Other Topics Concern  . Not on file   Social History Narrative   Patient lives alone and with her 2 children( 20 years and 88 years old). Sexually active iwthon epartner, does not use condom.   Family History  Problem Relation Age of Onset  . Cancer Mother     lung cancer  . Colon cancer Neg Hx    No Known Allergies Current Outpatient Prescriptions on File Prior to Visit  Medication Sig Dispense Refill  . ferrous sulfate 325 (65 FE) MG tablet Take 1 tablet (325 mg total) by mouth 3 (three) times daily with meals.  90 tablet  5  . HYDROcodone-acetaminophen (NORCO) 5-325 MG per tablet Take 1 tablet by mouth every 6 (six) hours as needed.             Objective:   Physical Exam General: alert, well-developed, and cooperative  to examination.  Head: normocephalic and atraumatic.  Eyes: vision grossly intact, pupils equal, pupils round, pupils reactive to light, no injection and anicteric.  Mouth: pharynx pink and moist, no erythema, and no exudates.  Neck: supple, full ROM, no thyromegaly, no JVD, and no carotid bruits.  Lungs: normal respiratory effort, no accessory muscle use, normal breath sounds, no crackles, and no wheezes. Heart: normal rate, regular rhythm, no murmur, no gallop, and no rub.  Abdomen: soft, non-tender, normal bowel sounds, no distention, no guarding, no rebound tenderness, no hepatomegaly, and no splenomegaly.  Msk: no joint swelling, no joint warmth, and no redness over joints.  Pulses: 2+ DP/PT pulses bilaterally Extremities: No cyanosis, clubbing, edema Neurologic: alert  & oriented X3, cranial nerves II-XII intact, strength normal in all extremities, sensation intact to light touch, and gait normal.  Skin: turgor normal and no rashes.  Psych: Oriented X3, memory intact for recent and remote, normally interactive, good eye contact, not anxious appearing, and not depressed appearing. GU: normal external genitalia, normal-appearing vaginal vault, small amount of grayish, thin, pus-appearing vaginal discharge with foul odor noted. Friable cervix  with erythema and small ulcers noted around the cervical orifice.  GC/Chlamydia , wet prep and endo & exocervical Pap obtained.     Assessment & Plan:

## 2011-04-14 LAB — URINALYSIS, ROUTINE W REFLEX MICROSCOPIC
Glucose, UA: NEGATIVE mg/dL
Leukocytes, UA: NEGATIVE
Nitrite: NEGATIVE
Protein, ur: NEGATIVE mg/dL
pH: 5.5 (ref 5.0–8.0)

## 2011-04-14 LAB — URINALYSIS, MICROSCOPIC ONLY
Bacteria, UA: NONE SEEN
Casts: NONE SEEN
Crystals: NONE SEEN

## 2011-04-14 LAB — GC/CHLAMYDIA PROBE AMP, GENITAL: Chlamydia, DNA Probe: NEGATIVE

## 2011-04-14 LAB — WET PREP BY MOLECULAR PROBE: Candida species: NEGATIVE

## 2011-04-14 NOTE — Progress Notes (Signed)
agree

## 2011-04-17 ENCOUNTER — Other Ambulatory Visit: Payer: Self-pay | Admitting: Internal Medicine

## 2011-04-17 DIAGNOSIS — B9689 Other specified bacterial agents as the cause of diseases classified elsewhere: Secondary | ICD-10-CM | POA: Insufficient documentation

## 2011-04-17 MED ORDER — METRONIDAZOLE 500 MG PO TABS: 500.0000 mg | ORAL_TABLET | Freq: Two times a day (BID) | ORAL | Status: AC

## 2011-04-17 MED ORDER — METRONIDAZOLE 500 MG PO TABS: 500.0000 mg | ORAL_TABLET | Freq: Two times a day (BID) | ORAL | Status: DC

## 2011-04-19 ENCOUNTER — Other Ambulatory Visit: Payer: Self-pay | Admitting: Hematology and Oncology

## 2011-04-19 ENCOUNTER — Other Ambulatory Visit (HOSPITAL_BASED_OUTPATIENT_CLINIC_OR_DEPARTMENT_OTHER): Payer: BC Managed Care – PPO | Admitting: Lab

## 2011-04-19 ENCOUNTER — Ambulatory Visit (HOSPITAL_COMMUNITY)
Admission: RE | Admit: 2011-04-19 | Discharge: 2011-04-19 | Disposition: A | Discharge status: Home / Self Care | Payer: BC Managed Care – PPO | Source: Ambulatory Visit | Attending: Hematology and Oncology | Admitting: Hematology and Oncology

## 2011-04-19 ENCOUNTER — Ambulatory Visit: Payer: BC Managed Care – PPO

## 2011-04-19 DIAGNOSIS — Z9049 Acquired absence of other specified parts of digestive tract: Secondary | ICD-10-CM | POA: Insufficient documentation

## 2011-04-19 DIAGNOSIS — C189 Malignant neoplasm of colon, unspecified: Secondary | ICD-10-CM | POA: Insufficient documentation

## 2011-04-19 DIAGNOSIS — Z9089 Acquired absence of other organs: Secondary | ICD-10-CM | POA: Insufficient documentation

## 2011-04-19 DIAGNOSIS — N269 Renal sclerosis, unspecified: Secondary | ICD-10-CM | POA: Insufficient documentation

## 2011-04-19 DIAGNOSIS — Z980 Intestinal bypass and anastomosis status: Secondary | ICD-10-CM | POA: Insufficient documentation

## 2011-04-19 LAB — CMP (CANCER CENTER ONLY)
ALT(SGPT): 19 U/L (ref 10–47)
AST: 24 U/L (ref 11–38)
Albumin: 3.3 g/dL (ref 3.3–5.5)
Alkaline Phosphatase: 68 U/L (ref 26–84)
BUN, Bld: 16 mg/dL (ref 7–22)
CO2: 30 meq/L (ref 18–33)
Calcium: 8.6 mg/dL (ref 8.0–10.3)
Chloride: 103 meq/L (ref 98–108)
Creat: 0.6 mg/dL (ref 0.6–1.2)
Glucose, Bld: 92 mg/dL (ref 73–118)
Potassium: 4.7 meq/L (ref 3.3–4.7)
Sodium: 137 meq/L (ref 128–145)
Total Bilirubin: 0.4 mg/dL (ref 0.20–1.60)
Total Protein: 7.3 g/dL (ref 6.4–8.1)

## 2011-04-19 LAB — CBC WITH DIFFERENTIAL/PLATELET
BASO%: 0.5 % (ref 0.0–2.0)
EOS%: 1.9 % (ref 0.0–7.0)
MCH: 25.5 pg (ref 25.1–34.0)
MCHC: 32.7 g/dL (ref 31.5–36.0)
MONO#: 0.7 10*3/uL (ref 0.1–0.9)
RDW: 17.2 % — ABNORMAL HIGH (ref 11.2–14.5)
WBC: 8 10*3/uL (ref 3.9–10.3)
lymph#: 3.3 10*3/uL (ref 0.9–3.3)
nRBC: 0 % (ref 0–0)

## 2011-04-19 MED ORDER — IOHEXOL 300 MG/ML  SOLN
100.0000 mL | Freq: Once | INTRAMUSCULAR | Status: AC | PRN
  Administered 2011-04-19: 100 mL via INTRAVENOUS

## 2011-04-19 NOTE — Progress Notes (Signed)
04-19-11 Patient accessed for CT scan today.  Labs drawn.

## 2011-04-19 NOTE — Patient Instructions (Signed)
Call M for problems.  CT staff will deaccess port after CT scan

## 2011-04-20 ENCOUNTER — Encounter: Payer: Self-pay | Admitting: *Deleted

## 2011-04-21 ENCOUNTER — Ambulatory Visit (HOSPITAL_BASED_OUTPATIENT_CLINIC_OR_DEPARTMENT_OTHER): Payer: BC Managed Care – PPO | Admitting: Hematology and Oncology

## 2011-04-21 VITALS — BP 148/93 | HR 61 | Temp 96.7°F | Ht 66.0 in | Wt 151.7 lb

## 2011-04-21 DIAGNOSIS — C257 Malignant neoplasm of other parts of pancreas: Secondary | ICD-10-CM

## 2011-04-21 DIAGNOSIS — C189 Malignant neoplasm of colon, unspecified: Secondary | ICD-10-CM

## 2011-04-21 DIAGNOSIS — G589 Mononeuropathy, unspecified: Secondary | ICD-10-CM

## 2011-04-21 MED ORDER — HEPARIN SOD (PORK) LOCK FLUSH 100 UNIT/ML IV SOLN
500.0000 [IU] | Freq: Once | INTRAVENOUS | Status: DC
  Filled 2011-04-21: qty 5

## 2011-04-21 MED ORDER — SODIUM CHLORIDE 0.9 % IJ SOLN
10.0000 mL | INTRAMUSCULAR | Status: DC | PRN
  Filled 2011-04-21: qty 10

## 2011-04-21 MED ORDER — ALTEPLASE 2 MG IJ SOLR
2.0000 mg | Freq: Once | INTRAMUSCULAR | Status: DC | PRN
  Filled 2011-04-21: qty 2

## 2011-04-21 NOTE — Progress Notes (Signed)
This office note has been dictated.

## 2011-04-21 NOTE — Progress Notes (Signed)
CC:   Dede Query, MD Velora Heckler, MD  IDENTIFYING STATEMENT:  The patient is a 50 year old woman with colon cancer who presents for followup.  INTERIM HISTORY:  Mrs. Diane Erickson was last seen 4 months ago.  Since that time she has had no major issues or concerns.  She reports stable weight.  She has no nausea, vomiting, abdominal pain, diarrhea, or rectal bleeding.  Reviewed results of a recent CT scan of the chest, abdomen and pelvis on 04/19/2011.  There is no evidence of disease within the chest, abdomen and pelvis.  There are no focal osseous lesions.  MEDICATIONS:  Reviewed and updated.  ALLERGIES:  None.  PAST MEDICAL HISTORY/FAMILY HISTORY/SOCIAL HISTORY:  Unchanged.  REVIEW OF SYSTEMS:  10 point review of systems negative.  PHYSICAL EXAMINATION:  The patient is a well-appearing, well-nourished woman in no distress.  Vitals:  Pulse 61, blood pressure 148/93, temperature 96.7, respirations 17, weight 151.7 pounds.  HEENT:  Head is atraumatic, normocephalic.  Sclerae anicteric.  Mouth moist.  Chest: Clear.  Port:  No signs of infection.  CVS:  1st and 2nd heart sounds present.  No added sounds or murmurs.  Abdomen:  Soft, nontender.  Bowel sounds present.  Extremities:  No edema.  Port accessed with no signs infection.  Extremities:  No calf tenderness.  Pulses present and symmetrical.  Lymph nodes:  No adenopathy.  CNS:  Nonfocal.  LABORATORY DATA:  04/19/2011 white cell count 8, hemoglobin 10.6, hematocrit 32.4, platelets 308.  Sodium 137, potassium 4.7, chloride 103, CO2 30, BUN 16, creatinine 0.6, glucose 92, t bilirubin 0.4, alkaline phosphatase 68, AST 24, ALT 19, calcium 8.6.  CEA 2.2.  Results of CTs are as in interval history.  IMPRESSION AND PLAN:  [name] is a 50 year old woman who is status post left colectomy with distal pancreatectomy on December 14, 2009 for 5 cm moderately differentiated adenocarcinoma of the colon with 4 of 15 positive lymph nodes.  She is  status post 6 months of adjuvant FOLFOX 6 with Neulasta support from February 02, 2010 through July 22, 2010. 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  Her current CT scans and blood work indicate no evidence of recurrence.  She is due for colonoscopy and that will be scheduled sometime in the next few months.  From my standpoint, she follows up in 6 months' time with lab work.    ______________________________ Laurice Record, M.D. LIO/MEDQ  D:  04/21/2011  T:  04/21/2011  Job:  [id number]

## 2011-04-24 ENCOUNTER — Telehealth: Payer: Self-pay | Admitting: Hematology and Oncology

## 2011-04-24 NOTE — Telephone Encounter (Signed)
Scheduled flush appts and lb/fu for July per 12/14 elec pof. S/w pt today re appts and gv pt next appt for 1/28 @ 4 pm. Pt to get schedule when she comes in.

## 2011-05-26 ENCOUNTER — Encounter: Payer: BC Managed Care – PPO | Admitting: Obstetrics and Gynecology

## 2011-06-05 ENCOUNTER — Ambulatory Visit (HOSPITAL_BASED_OUTPATIENT_CLINIC_OR_DEPARTMENT_OTHER): Payer: BC Managed Care – PPO

## 2011-06-05 ENCOUNTER — Other Ambulatory Visit: Payer: Self-pay | Admitting: *Deleted

## 2011-06-05 DIAGNOSIS — C189 Malignant neoplasm of colon, unspecified: Secondary | ICD-10-CM

## 2011-06-05 DIAGNOSIS — Z452 Encounter for adjustment and management of vascular access device: Secondary | ICD-10-CM

## 2011-06-05 DIAGNOSIS — C185 Malignant neoplasm of splenic flexure: Secondary | ICD-10-CM

## 2011-06-05 MED ORDER — HEPARIN SOD (PORK) LOCK FLUSH 100 UNIT/ML IV SOLN
500.0000 [IU] | Freq: Once | INTRAVENOUS | Status: AC
  Administered 2011-06-05: 500 [IU] via INTRAVENOUS
  Filled 2011-06-05: qty 5

## 2011-06-05 MED ORDER — SODIUM CHLORIDE 0.9 % IJ SOLN
10.0000 mL | INTRAMUSCULAR | Status: DC | PRN
  Administered 2011-06-05: 10 mL via INTRAVENOUS
  Filled 2011-06-05: qty 10

## 2011-06-05 MED ORDER — LIDOCAINE-PRILOCAINE 2.5-2.5 % EX CREA: TOPICAL_CREAM | CUTANEOUS | Status: DC | PRN

## 2011-06-21 ENCOUNTER — Ambulatory Visit (INDEPENDENT_AMBULATORY_CARE_PROVIDER_SITE_OTHER): Payer: BC Managed Care – PPO | Admitting: Physician Assistant

## 2011-06-21 ENCOUNTER — Encounter: Payer: Self-pay | Admitting: Physician Assistant

## 2011-06-21 VITALS — BP 129/81 | HR 72 | Temp 96.9°F | Resp 16 | Ht 66.0 in | Wt 148.2 lb

## 2011-06-21 DIAGNOSIS — IMO0002 Reserved for concepts with insufficient information to code with codable children: Secondary | ICD-10-CM

## 2011-06-21 DIAGNOSIS — N941 Unspecified dyspareunia: Secondary | ICD-10-CM | POA: Insufficient documentation

## 2011-06-21 NOTE — Patient Instructions (Signed)
Dyspareunia Dyspareunia is pain during sexual intercourse. It is most common in women, but it also happens in men.  CAUSES  Female The pain from this condition is usually felt when anything is put into the vagina, but any part of the genitals may cause pain during sex. Even sitting or wearing pants can cause pain. Sometimes, a cause cannot be found. Some causes of pain during intercourse are:  Infections of the skin around the vagina.   Vaginal infections, such as a yeast, bacterial, or viral infection.   Vaginismus. This is the inability to have anything put in the vagina even when the woman wants it to happen. There is an automatic muscle contraction and pain. The pain of the muscle contraction can be so severe that intercourse is impossible.   Allergic reaction from spermicides, semen, condoms, scented tampons, soaps, douches, and vaginal sprays.   A fluid-filled sac (cyst) on the Bartholin or Skene glands, located at the opening of the vagina.   Scar tissue in the vagina from a surgically enlarged opening (episiotomy) or tearing after delivering a baby.   Vaginal dryness. This is more common in menopause. The normal secretions of the vagina are decreased. Changes in estrogen levels and increased difficulty becoming aroused can cause painful sex. Vaginal dryness can also happen when taking birth control pills.   Thinning of the tissue (atrophy) of the vulva and vagina. This makes the area thinner, smaller, unable to stretch to accommodate a penis, and prone to infection and tearing.   Vulvar vestibulitis or vestibulodynia.This is a condition that causes pain involving the area around the entrance to the vagina.The most common cause in young women is birth control pills.Women with low estrogen levels (postmenopausal women) may also experience this.Other causes include allergic reactions, too many nerve endings, skin conditions, and pelvic muscles that cannot relax.   Vulvar dermatoses.  This includes skin conditions such as lichen sclerosus and lichen planus.   Lack of foreplay to lubricate the vagina. This can cause vaginal dryness.   Noncancerous tumors (fibroids) in the uterus.   Uterus lining tissue growing outside the uterus (endometriosis).   Pregnancy that starts in the fallopian tube (tubal pregnancy).   Pregnancy or breastfeeding your baby. This can cause vaginal dryness.   A tilting or prolapse of the uterus. Prolapse is when weak and stretched muscles around the uterus allow it to fall into the vagina.   Problems with the ovaries, cysts, or scar tissue. This may be worse with certain sexual positions.   Previous surgeries causing adhesions or scar tissue in the vagina or pelvis.   Bladder and intestinal problems.   Psychological problems (such as depression or anxiety). This may make pain worse.   Negative attitudes about sex, experiencing rape, sexual assault, and misinformation about sex. These issues are often related to some types of pain.   Previous pelvic infection, causing scar tissue in the pelvis and on the female organs.   Cyst or tumor on the ovary.   Cancer of the female organs.   Certain medicines.   Medical problems such as diabetes, arthritis, or thyroid disease.  Female In men, there are many physical causes of sexual discomfort. Some causes of pain during intercourse are:  Infections of the prostate, bladder, or seminal vesicles. This can cause pain after ejaculation.   An inflamed bladder (interstitial cystitis). This may cause pain from ejaculation.   Gonorrheal infections. This may cause pain during ejaculation.   An inflamed urethra (urethritis) or inflamed   prostate (prostatitis). This can make genital stimulation painful or uncomfortable.   Deformities of the penis, such as Peyronie's disease.   A tight foreskin.   Cancer of the female organs.   Psychological problems. This may make pain worse.  DIAGNOSIS   Your  caregiver will take a history and have you describe where the pain is located (outside the vagina, in the vagina, in the pelvis). You may be asked when you experience pain, such as with penetration or with thrusting.   Following this, your caregiver will do a physical exam. Let your caregiver know if the exam is too painful.   During the final part of the female exam, your caregiver will feel your uterus and ovaries with one hand on the abdomen and one finger in your vagina. This is a pelvic exam.   Blood tests, a Pap test, cultures for infection, an ultrasound test, and X-rays may be done. You may need to see a specialist for female problems (gynecologist).   Your caregiver may do a CT scan, MRI, or laparoscopy. Laparoscopy is a procedure to look into the pelvis with a lighted tube, through a cut (incision) in the abdomen.  TREATMENT  Your caregiver can help you determine the best course of treatment. Sometimes, more testing is done. Continue with the suggested testing until your caregiver feels sure about your diagnosis and how to treat it. Sometimes, it is difficult to find the reason for the pain. The search for the cause and treatment can be frustrating. Treatment often takes several weeks to a few months before you notice any improvement. You may also need to avoid sexual activity until symptoms improve.Continuing to have sex when it hurts can delay healing and actually make the problem worse. The treatment depends on the cause of the pain. Treatment may include:  Medicines such as antibiotics, vaginal or skin creams, hormones, or antidepressants.   Minor or major surgery.   Psychological counseling or group therapy.   Kegel exercises and vaginal dilators to help certain cases of vaginismus (spasms). Do this only if recommended by your caregiver.Kegel exercises can make some problems worse.   Applying lubrication as recommended by your caregiver if you have dryness.   Sex therapy for  you and your sex partner.  It is common for the pain to continue after the reason for the pain has been treated. Some reasons for this include a conditioned response. This means the person having the pain becomes so familiar with the pain that the pain continues as a response, even though the cause is removed. Sex therapy can help with this problem. HOME CARE INSTRUCTIONS   Follow your caregiver's instructions about taking medicines, tests, counseling, and follow-up treatment.   Do not use scented tampons, douches, vaginal sprays, or soaps.   Use water-based lubricants for dryness. Oil lubricants can cause irritation.   Do not use spermicides or condoms that irritate you.   Openly discuss with your partner your sexual experience, your desires, foreplay, and different sexual positions for a more comfortable and enjoyable sexual relationship.   Join group sessions for therapy, if needed.   Practice safe sex at all times.   Empty your bladder before having intercourse.   Try different positions during sexual intercourse.   Take over-the-counter pain medicine recommended by your caregiver before having sexual intercourse.   Do not wear pantyhose. Knee-high and thigh-high hose are okay.   Avoid scrubbing your vulva with a washcloth. Wash the area gently and pat dry   with a towel.  SEEK MEDICAL CARE IF:   You develop vaginal bleeding after sexual intercourse.   You develop a lump at the opening of your vagina, even if it is not painful.   You have abnormal vaginal discharge.   You have vaginal dryness.   You have itching or irritation of the vulva or vagina.   You develop a rash or reaction to your medicine.  SEEK IMMEDIATE MEDICAL CARE IF:   You develop severe abdominal pain during or shortly after sexual intercourse. You could have a ruptured ovarian cyst or ruptured tubal pregnancy.   You have a fever.   You have painful or bloody urination.   You have painful sexual  intercourse, and you never had it before.   You pass out after having sexual intercourse.  Document Released: 05/14/2007 Document Revised: 01/04/2011 Document Reviewed: 07/25/2010 [employer] Patient Information 2012 [employer]. 

## 2011-06-21 NOTE — Progress Notes (Signed)
Chief Complaint:  Dyspareunia   Diane Erickson is  51 y.o. G2P2.  Patient's last menstrual period was 05/09/2011.. She presents complaining of Dyspareunia . Onset is described as ongoing and has been present for  1 years. Describes tearing/sharp vaginal pain with penetration during intercourse. Reports intercourse 1 monthly without foreplay or additional lubrication. Denies abd pain, vaginal bleeding, unusual vaginal discharge, or urinary s/s  Obstetrical/Gynecological History: OB History    Grav Para Term Preterm Abortions TAB SAB Ect Mult Living   2 2        2       Past Medical History: Past Medical History  Diagnosis Date  . Colon cancer     colon ca dx 8/11    Past Surgical History: Past Surgical History  Procedure Date  . Colon surgery     left colectomy, distal pancreatectomy, splenectomy  . Tubal ligation     1997    Family History: Family History  Problem Relation Age of Onset  . Cancer Mother     lung cancer  . Colon cancer Neg Hx     Social History: History  Substance Use Topics  . Smoking status: Former Games developer  . Smokeless tobacco: Not on file  . Alcohol Use: Yes     occasional alcohol, wine    Allergies: No Known Allergies   Review of Systems - Negative except what has been reviewed in HPI  Physical Exam   Blood pressure 129/81, pulse 72, temperature 96.9 F (36.1 C), temperature source Oral, resp. rate 16, height 5\' 6"  (1.676 m), weight 148 lb 3.2 oz (67.223 kg), last menstrual period 05/09/2011.  General: General appearance - alert, well appearing, and in no distress, oriented to person, place, and time and normal appearing weight Mental status - alert, oriented to person, place, and time, normal mood, behavior, speech, dress, motor activity, and thought processes Focused Gynecological Exam: VULVA: normal appearing vulva with no masses, tenderness or lesions, VAGINA: atrophic, vaginal tenderness mild, malodorous discharge CERVIX: normal  appearing cervix without discharge or lesions, UTERUS: uterus is normal size, shape, consistency and nontender     Assessment:  Patient Active Problem List  Diagnoses  . ADENOCARCINOMA, COLON  . ANEMIA, IRON DEFICIENCY  . Preventative health care  . Pap smear for cervical cancer screening  . Gardnerella vaginitis  . Dyspareunia, female    Plan: Wet prep and cultures obtained and sent. Will call with results if treatment needed Recommend extended foreplay and additional lubrication (Astroglide) with intercourse. RTC 3 months for re-chk of symptoms or prn worsening, additional problems    Kamrin Sibley E. 06/21/2011,3:34 PM

## 2011-06-22 LAB — WET PREP, GENITAL
Clue Cells Wet Prep HPF POC: NONE SEEN
Trich, Wet Prep: NONE SEEN

## 2011-06-22 LAB — GC/CHLAMYDIA PROBE AMP, GENITAL
Chlamydia, DNA Probe: NEGATIVE
GC Probe Amp, Genital: NEGATIVE

## 2011-07-17 ENCOUNTER — Ambulatory Visit (HOSPITAL_BASED_OUTPATIENT_CLINIC_OR_DEPARTMENT_OTHER): Payer: BC Managed Care – PPO

## 2011-07-17 DIAGNOSIS — C189 Malignant neoplasm of colon, unspecified: Secondary | ICD-10-CM

## 2011-07-17 DIAGNOSIS — Z452 Encounter for adjustment and management of vascular access device: Secondary | ICD-10-CM

## 2011-07-17 MED ORDER — HEPARIN SOD (PORK) LOCK FLUSH 100 UNIT/ML IV SOLN
500.0000 [IU] | Freq: Once | INTRAVENOUS | Status: AC
  Administered 2011-07-17: 500 [IU] via INTRAVENOUS
  Filled 2011-07-17: qty 5

## 2011-07-17 MED ORDER — SODIUM CHLORIDE 0.9 % IJ SOLN
10.0000 mL | INTRAMUSCULAR | Status: DC | PRN
  Administered 2011-07-17: 10 mL via INTRAVENOUS
  Filled 2011-07-17: qty 10

## 2011-08-01 IMAGING — CT CT ABD-PELV W/ CM
2 of 5 series · 11 of 32 positions shown, 16 images · IV contrast (omnipaque)
Comparison: None.

CLINICAL DATA: Anemia and abdominal pain; status post resection of
primary colonic malignancy.  Assess for metastases.

CT ABDOMEN AND PELVIS WITH CONTRAST
TECHNIQUE: Multidetector CT imaging of the abdomen and pelvis was
performed following the standard protocol during bolus
administration of intravenous contrast.
Contrast: 100 mL of Omnipaque 300 IV contrast

[Series 2: routine abdomen · axial · 0.75mm/px · z∈[-374,-74]mm · 5 of 91 slices shown, 10 images]
[im 16/91  soft-tissue]
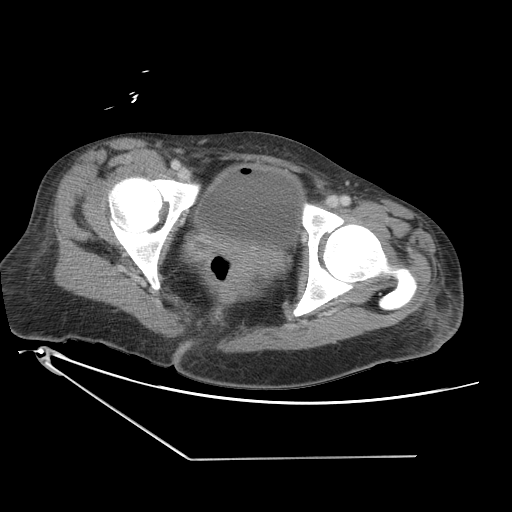
[im 16/91  bone]
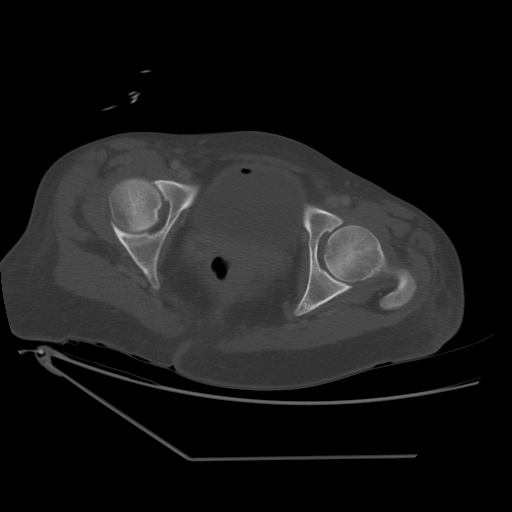
[im 31/91  soft-tissue]
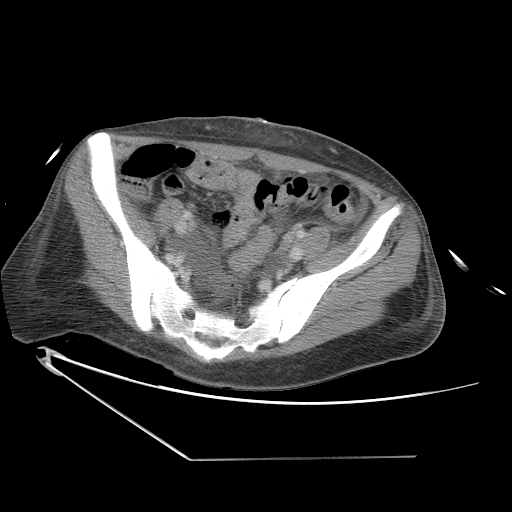
[im 31/91  lung]
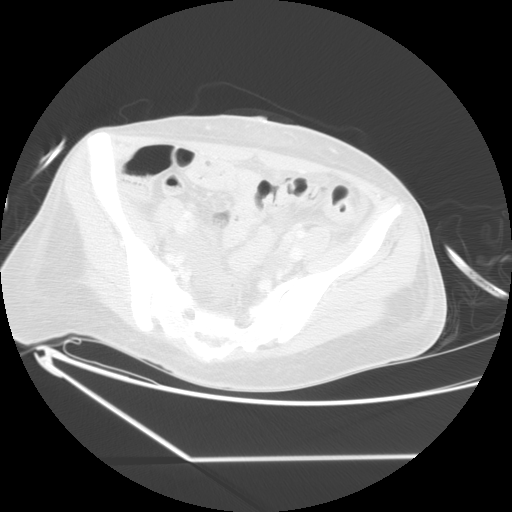
[im 46/91  soft-tissue]
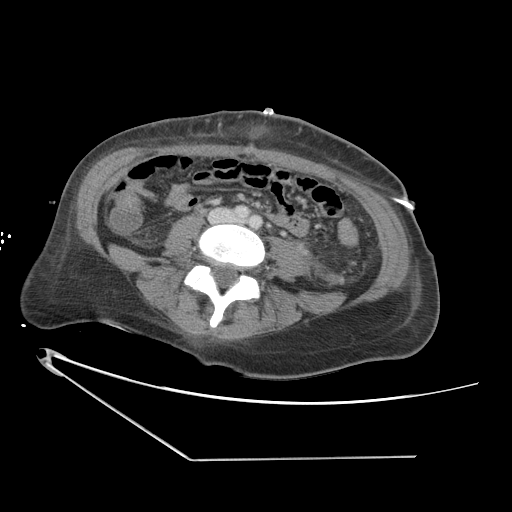
[im 46/91  lung]
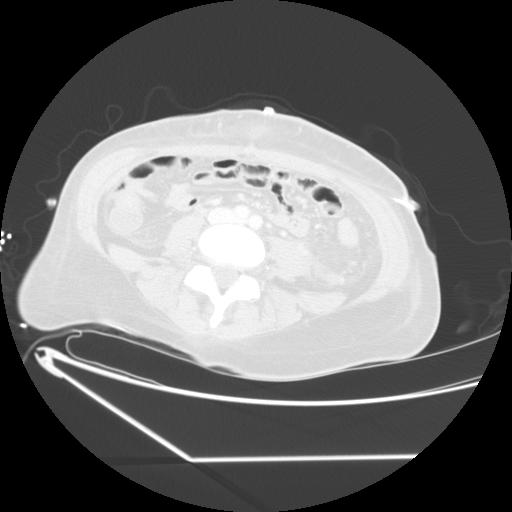
[im 61/91  soft-tissue]
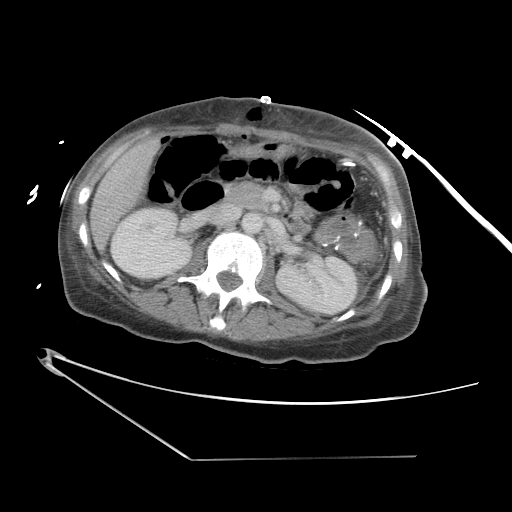
[im 61/91  lung]
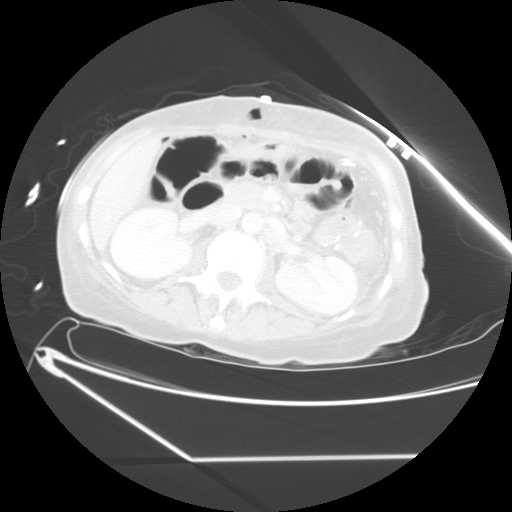
[im 76/91  soft-tissue]
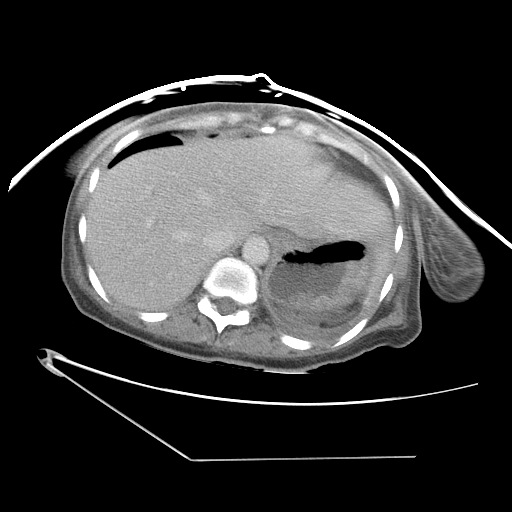
[im 76/91  lung]
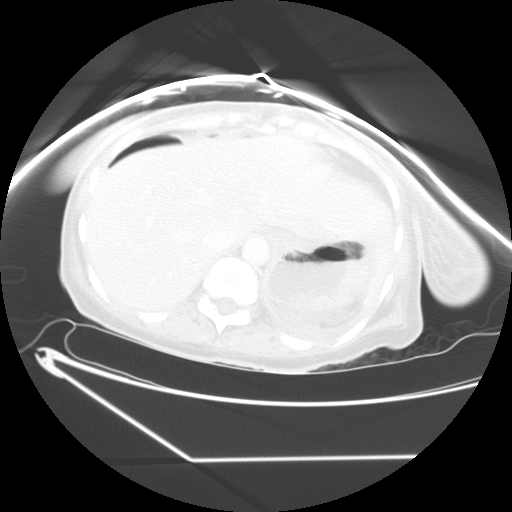

[Series 400: reformatted · sagittal · 0.90mm/px · 6 of 171 slices shown]
[im 16/171  soft-tissue]
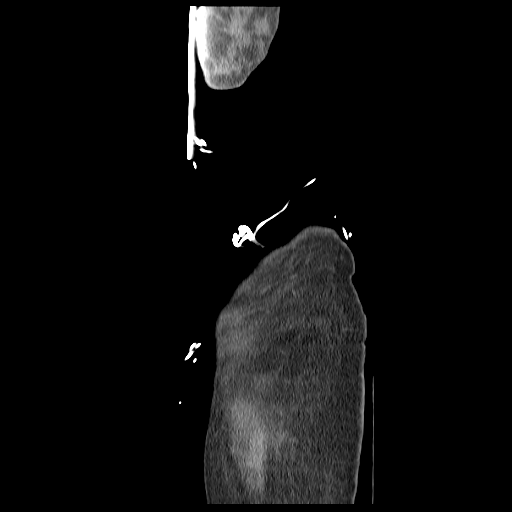
[im 31/171  soft-tissue]
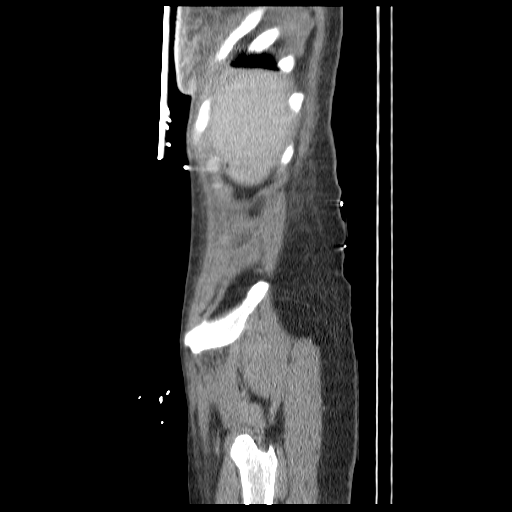
[im 62/171  soft-tissue]
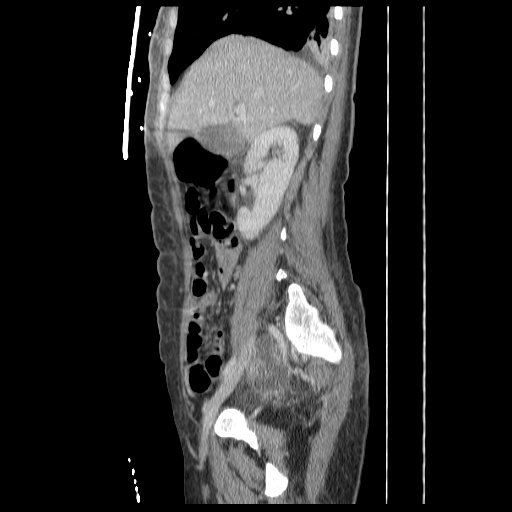
[im 78/171  soft-tissue]
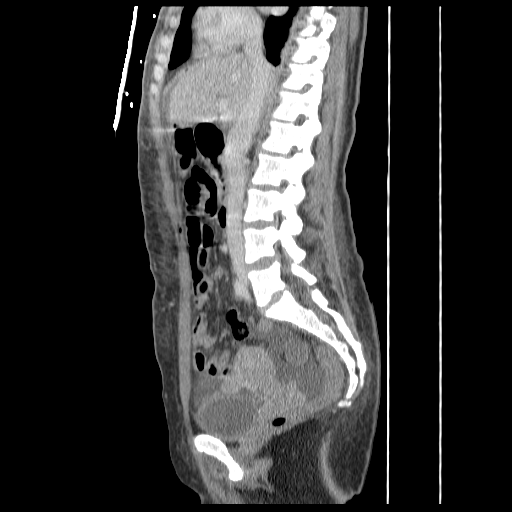
[im 93/171  soft-tissue]
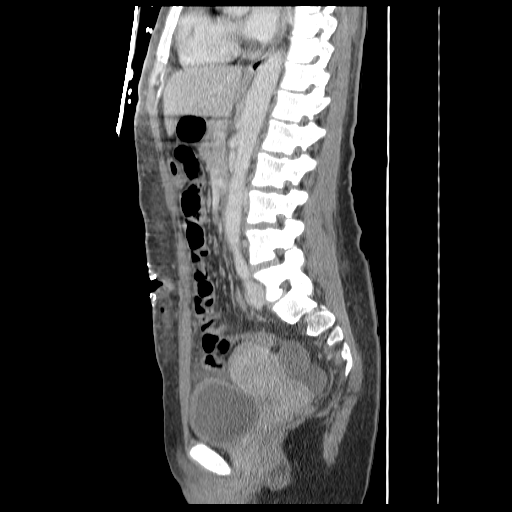
[im 109/171  soft-tissue]
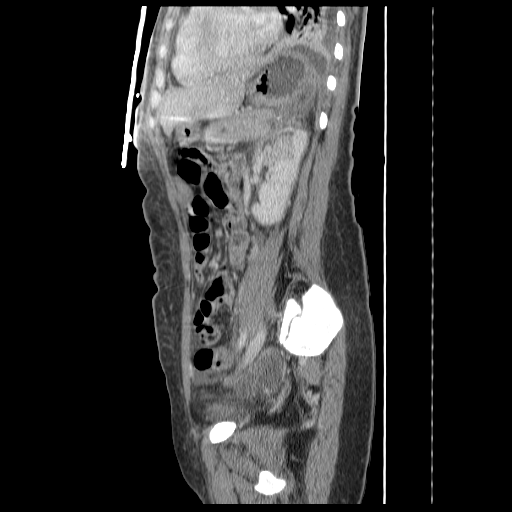

[11 of 32 positions shown; findings below may reference images not displayed]

FINDINGS: Small left and trace right pleural effusions are noted,
with partial consolidation of both lower lung lobes, likely
reflecting postoperative atelectasis.

The patient is status post resection of a colonic mass, with a
prominent bowel suture line along the splenic flexure of the colon.
There is persistent apparent colonic wall thickening at the site of
surgery; this may be postoperative in nature.

In addition, the patient is status post resection of the pancreatic
tail.  The patient is also status post splenectomy.  There is a 4
cm focus of decreased attenuation involving the upper pole of the
left kidney, extending to the renal pelvis, likely reflecting mild
renal injury.  On delayed images, contrast extends to the periphery
of the kidney adjacent to the region of injury, without definite
evidence of extravasation outside of the renal capsule.

A small amount of fluid is noted within the left upper quadrant,
with associated stranding; fluid tracks along the left paracolic
gutter into the pelvis.  A small amount of free fluid is noted
within the pelvis.  An associated drain is seen at the left upper
quadrant.  There is persistent serosanguinous output from the
drainage catheter, per clinical correlation; fluid may simply be
postoperative in nature, as no abnormal output is seen to suggest a
bile leak.

Scattered postoperative air is noted within the abdomen.  The
midline anterior abdominal wall incision demonstrates mild
associated soft tissue air and stranding, with overlying skin
staples.

The liver and this gallbladder are unremarkable in appearance.  The
remainder of the pancreas is grossly unremarkable.  Stranding is
noted about the left adrenal gland; the right adrenal gland is
within normal limits.  A 1.1 cm cyst is noted at the upper pole of
the right kidney; a smaller 5 mm cyst is noted near the lower pole
of the left kidney. There is no evidence of hydronephrosis; no
renal or ureteral stones identified.

The stomach is surrounded with stranding and fluid, but appears
grossly unremarkable.  No definite small bowel abnormalities are
seen.  No acute vascular abnormalities are identified.  Incidental
note is made of a circumaortic left renal vein.  Mild scattered
calcification is noted at the distal abdominal aorta.

The appendix is not to fully seen, but there is no evidence for
acute appendicitis.  The remainder of the colon is grossly
unremarkable in appearance, though the colon at the level of the
splenic flexure is difficult to fully assessed.

A 2.2 cm isodense lesion arising at the anterior aspect of the
uterus on the right likely reflects an exophytic fibroid.  The
uterus is otherwise unremarkable in appearance.  The ovaries are
relatively symmetric in appearance.  No suspicious adnexal masses
are seen.

The bladder is mildly distended; a small focus of air within the
bladder likely reflects recent Foley catheter placement.  No
inguinal lymphadenopathy is seen.

No acute osseous abnormalities are identified.  No definite osseous
metastases are seen.
IMPRESSION: 1.  Mild wall thickening at the site of colonic mass resection
likely reflects postoperative change.  Associated surrounding fluid
and stranding noted.
2.  Status post partial resection of the tail of the pancreas, and
status post splenectomy.  No definite evidence to suggest bile
leak.
3.  Apparent 4 cm injury to the upper pole of the left kidney,
extending to the renal pelvis.  Associated contrast seen extending
adjacent to the injury at the periphery of the kidney, without
evidence of extravasation outside the renal capsule.
4.  Left upper quadrant drain noted ending adjacent to the site of
surgery.
5.  Small left and trace right pleural effusions, with bibasilar
airspace consolidation, likely reflecting postoperative
atelectasis.
6.  Small amount of free fluid in the left upper quadrant, left
paracolic gutter and pelvis.

7.  No definite evidence of metastatic disease within the abdomen
or pelvis.
8.  Small bilateral renal cysts noted.
9.  Likely fibroid uterus seen.
10.  Postoperative intraperitoneal air and soft tissue air and
stranding along the anterior abdominal wall remain within normal
limits.

## 2011-08-28 ENCOUNTER — Other Ambulatory Visit: Payer: Self-pay | Admitting: *Deleted

## 2011-08-30 ENCOUNTER — Telehealth: Payer: Self-pay | Admitting: Hematology and Oncology

## 2011-08-30 NOTE — Telephone Encounter (Signed)
Pt lmonvm asking that 4/22 appt be r/s to another date @ the same time. R/s flush appt to 4/30 @ 4 pm. Called pt but was not able to reach her or lm. Will try again.

## 2011-09-05 ENCOUNTER — Other Ambulatory Visit: Payer: Self-pay | Admitting: Nurse Practitioner

## 2011-09-07 ENCOUNTER — Telehealth: Payer: Self-pay | Admitting: Hematology and Oncology

## 2011-09-07 NOTE — Telephone Encounter (Signed)
Still not able to reach pt re flush appt. Pt's phone just rings continually. Scheduled flush appt for 5/10 and mailed schedule today.

## 2011-09-15 ENCOUNTER — Ambulatory Visit (HOSPITAL_BASED_OUTPATIENT_CLINIC_OR_DEPARTMENT_OTHER): Payer: BC Managed Care – PPO

## 2011-09-15 VITALS — BP 126/84 | HR 87 | Temp 98.0°F

## 2011-09-15 DIAGNOSIS — Z452 Encounter for adjustment and management of vascular access device: Secondary | ICD-10-CM

## 2011-09-15 DIAGNOSIS — C189 Malignant neoplasm of colon, unspecified: Secondary | ICD-10-CM

## 2011-09-15 MED ORDER — HEPARIN SOD (PORK) LOCK FLUSH 100 UNIT/ML IV SOLN
500.0000 [IU] | Freq: Once | INTRAVENOUS | Status: AC
  Administered 2011-09-15: 500 [IU] via INTRAVENOUS
  Filled 2011-09-15: qty 5

## 2011-09-15 MED ORDER — SODIUM CHLORIDE 0.9 % IJ SOLN
10.0000 mL | INTRAMUSCULAR | Status: DC | PRN
  Administered 2011-09-15: 10 mL via INTRAVENOUS
  Filled 2011-09-15: qty 10

## 2011-10-09 ENCOUNTER — Ambulatory Visit (HOSPITAL_BASED_OUTPATIENT_CLINIC_OR_DEPARTMENT_OTHER): Payer: BC Managed Care – PPO

## 2011-10-09 VITALS — BP 130/89 | HR 82 | Temp 97.9°F

## 2011-10-09 DIAGNOSIS — Z452 Encounter for adjustment and management of vascular access device: Secondary | ICD-10-CM

## 2011-10-09 DIAGNOSIS — C189 Malignant neoplasm of colon, unspecified: Secondary | ICD-10-CM

## 2011-10-09 MED ORDER — SODIUM CHLORIDE 0.9 % IJ SOLN
10.0000 mL | INTRAMUSCULAR | Status: DC | PRN
  Administered 2011-10-09: 10 mL via INTRAVENOUS
  Filled 2011-10-09: qty 10

## 2011-10-09 MED ORDER — HEPARIN SOD (PORK) LOCK FLUSH 100 UNIT/ML IV SOLN
500.0000 [IU] | Freq: Once | INTRAVENOUS | Status: AC
  Administered 2011-10-09: 500 [IU] via INTRAVENOUS
  Filled 2011-10-09: qty 5

## 2011-11-15 ENCOUNTER — Telehealth: Payer: Self-pay | Admitting: *Deleted

## 2011-11-15 NOTE — Telephone Encounter (Signed)
Spoke with pt and gave pt new date and time for f/u with Ramond Craver, NP on 11/22/11  At  1045 am.   Pt voiced understanding.

## 2011-11-17 ENCOUNTER — Other Ambulatory Visit: Payer: BC Managed Care – PPO

## 2011-11-17 ENCOUNTER — Other Ambulatory Visit: Payer: BC Managed Care – PPO | Admitting: Lab

## 2011-11-21 ENCOUNTER — Ambulatory Visit: Payer: BC Managed Care – PPO | Admitting: Hematology and Oncology

## 2011-11-22 ENCOUNTER — Telehealth: Payer: Self-pay | Admitting: Hematology and Oncology

## 2011-11-22 ENCOUNTER — Ambulatory Visit (HOSPITAL_BASED_OUTPATIENT_CLINIC_OR_DEPARTMENT_OTHER): Payer: Self-pay

## 2011-11-22 ENCOUNTER — Ambulatory Visit (HOSPITAL_BASED_OUTPATIENT_CLINIC_OR_DEPARTMENT_OTHER): Payer: Self-pay | Admitting: Nurse Practitioner

## 2011-11-22 ENCOUNTER — Other Ambulatory Visit: Payer: Self-pay | Admitting: *Deleted

## 2011-11-22 ENCOUNTER — Encounter: Payer: Self-pay | Admitting: Nurse Practitioner

## 2011-11-22 VITALS — BP 135/87 | HR 67 | Temp 97.5°F | Ht 66.0 in | Wt 151.9 lb

## 2011-11-22 DIAGNOSIS — C184 Malignant neoplasm of transverse colon: Secondary | ICD-10-CM

## 2011-11-22 DIAGNOSIS — C189 Malignant neoplasm of colon, unspecified: Secondary | ICD-10-CM

## 2011-11-22 DIAGNOSIS — C778 Secondary and unspecified malignant neoplasm of lymph nodes of multiple regions: Secondary | ICD-10-CM

## 2011-11-22 LAB — COMPREHENSIVE METABOLIC PANEL
AST: 17 U/L (ref 0–37)
Albumin: 4.2 g/dL (ref 3.5–5.2)
Alkaline Phosphatase: 36 U/L — ABNORMAL LOW (ref 39–117)
Glucose, Bld: 94 mg/dL (ref 70–99)
Potassium: 3.7 mEq/L (ref 3.5–5.3)
Sodium: 139 mEq/L (ref 135–145)
Total Bilirubin: 0.4 mg/dL (ref 0.3–1.2)
Total Protein: 7 g/dL (ref 6.0–8.3)

## 2011-11-22 LAB — CBC WITH DIFFERENTIAL/PLATELET
BASO%: 0.2 % (ref 0.0–2.0)
EOS%: 0.8 % (ref 0.0–7.0)
HCT: 35.7 % (ref 34.8–46.6)
MCH: 28.2 pg (ref 25.1–34.0)
MCHC: 33.6 g/dL (ref 31.5–36.0)
MONO#: 0.6 10*3/uL (ref 0.1–0.9)
NEUT%: 55.9 % (ref 38.4–76.8)
RBC: 4.25 10*6/uL (ref 3.70–5.45)
RDW: 17.1 % — ABNORMAL HIGH (ref 11.2–14.5)
WBC: 6.4 10*3/uL (ref 3.9–10.3)
lymph#: 2.2 10*3/uL (ref 0.9–3.3)
nRBC: 0 % (ref 0–0)

## 2011-11-22 LAB — CEA: CEA: 3.1 ng/mL (ref 0.0–5.0)

## 2011-11-22 MED ORDER — SODIUM CHLORIDE 0.9 % IJ SOLN
10.0000 mL | INTRAMUSCULAR | Status: DC | PRN
  Administered 2011-11-22: 10 mL via INTRAVENOUS
  Filled 2011-11-22: qty 10

## 2011-11-22 MED ORDER — HEPARIN SOD (PORK) LOCK FLUSH 100 UNIT/ML IV SOLN
500.0000 [IU] | Freq: Once | INTRAVENOUS | Status: AC
  Administered 2011-11-22: 500 [IU] via INTRAVENOUS
  Filled 2011-11-22: qty 5

## 2011-11-22 NOTE — Telephone Encounter (Signed)
appts made and printed for pt aom °

## 2011-11-22 NOTE — Progress Notes (Signed)
CC:   Diane Query, MD Diane Heckler, MD  IDENTIFYING STATEMENT:  The patient is a 51 year old woman with colon cancer who presents for followup.  INTERIM HISTORY:  Diane Erickson was last seen 7 months ago.  Since that time she has had no major issues or concerns.  She reports stable weight.  She has no nausea, vomiting, abdominal pain, diarrhea, or rectal bleeding. Her main concern is that she has had recurring intermittent chest discomfort and some intermittent redness of her left eye. She was urged to contact her primary care physician for further evaluation and work up of these symptoms. She denies any discomfort today and there is no redness noted to either eye.    MEDICATIONS:  Reviewed and updated.  ALLERGIES:  None.  PAST MEDICAL HISTORY/FAMILY HISTORY/SOCIAL HISTORY:  Unchanged.  REVIEW OF SYSTEMS:  10 point review of systems negative.  PHYSICAL EXAMINATION:  The patient is a well-appearing, well-nourished woman in no distress.  Filed Vitals:   11/22/11 1045  BP: 135/87  Pulse: 67  Temp: 97.5 F (36.4 C)     HEENT:  Head is atraumatic, normocephalic.  Sclerae anicteric.  Mouth moist.  Chest: Clear.  Port:  No signs of infection.  CVS:  1st and 2nd heart sounds present.  No added sounds or murmurs.  Abdomen:  Soft, nontender.  Bowel sounds present.  Extremities:  No edema.  Port accessed with no signs infection.  Extremities:  No calf tenderness.  Pulses present and symmetrical.  Lymph nodes:  No adenopathy.  CNS:  Nonfocal.  LABORATORY DATA: Patient did not present as planned prior to visit for labs. Labs were drawn today and are pending at time of this dictation.  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  She is status post 6 months of adjuvant FOLFOX 6 with Neulasta support from  February 02, 2010 through July 22, 2010. 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  Her bloodwork is pending, but she has no clinical signs or symptoms of recurrence.  From my standpoint, she follows up in 6 months' time with lab work. She has some general health issues -- specifically some chest discomfort and eye redness that are intermittent. She was strongly urged to pursue further evaluation of these matters with her primary care physician. We will see her back in 6 months (with labs drawn 48 hours prior to visit) though she was urged to call if she has any questions, comments or concerns prior to her scheduled appt.     ______________________________ Bobbe Medico, NP-C, AOCNP.

## 2012-01-03 ENCOUNTER — Ambulatory Visit (HOSPITAL_BASED_OUTPATIENT_CLINIC_OR_DEPARTMENT_OTHER): Payer: Medicaid Other

## 2012-01-03 VITALS — BP 132/84 | HR 73 | Temp 97.8°F | Resp 18

## 2012-01-03 DIAGNOSIS — Z452 Encounter for adjustment and management of vascular access device: Secondary | ICD-10-CM

## 2012-01-03 DIAGNOSIS — C189 Malignant neoplasm of colon, unspecified: Secondary | ICD-10-CM

## 2012-01-03 MED ORDER — SODIUM CHLORIDE 0.9 % IJ SOLN
10.0000 mL | INTRAMUSCULAR | Status: DC | PRN
  Administered 2012-01-03: 10 mL via INTRAVENOUS
  Filled 2012-01-03: qty 10

## 2012-01-03 MED ORDER — HEPARIN SOD (PORK) LOCK FLUSH 100 UNIT/ML IV SOLN
500.0000 [IU] | Freq: Once | INTRAVENOUS | Status: AC
  Administered 2012-01-03: 500 [IU] via INTRAVENOUS
  Filled 2012-01-03: qty 5

## 2012-02-14 ENCOUNTER — Ambulatory Visit (HOSPITAL_BASED_OUTPATIENT_CLINIC_OR_DEPARTMENT_OTHER): Payer: Medicaid Other

## 2012-02-14 VITALS — BP 139/84 | HR 67 | Temp 98.0°F | Resp 17

## 2012-02-14 DIAGNOSIS — C184 Malignant neoplasm of transverse colon: Secondary | ICD-10-CM

## 2012-02-14 DIAGNOSIS — C189 Malignant neoplasm of colon, unspecified: Secondary | ICD-10-CM

## 2012-02-14 DIAGNOSIS — C778 Secondary and unspecified malignant neoplasm of lymph nodes of multiple regions: Secondary | ICD-10-CM

## 2012-02-14 DIAGNOSIS — Z452 Encounter for adjustment and management of vascular access device: Secondary | ICD-10-CM

## 2012-02-14 MED ORDER — HEPARIN SOD (PORK) LOCK FLUSH 100 UNIT/ML IV SOLN
500.0000 [IU] | Freq: Once | INTRAVENOUS | Status: AC
  Administered 2012-02-14: 500 [IU] via INTRAVENOUS
  Filled 2012-02-14: qty 5

## 2012-02-14 MED ORDER — SODIUM CHLORIDE 0.9 % IJ SOLN
10.0000 mL | INTRAMUSCULAR | Status: DC | PRN
  Administered 2012-02-14: 10 mL via INTRAVENOUS
  Filled 2012-02-14: qty 10

## 2012-03-14 ENCOUNTER — Encounter: Payer: Self-pay | Admitting: Internal Medicine

## 2012-03-14 ENCOUNTER — Ambulatory Visit (INDEPENDENT_AMBULATORY_CARE_PROVIDER_SITE_OTHER): Payer: Medicaid Other | Admitting: Internal Medicine

## 2012-03-14 VITALS — BP 134/81 | HR 78 | Temp 97.9°F | Ht 66.0 in | Wt 159.3 lb

## 2012-03-14 DIAGNOSIS — Z1231 Encounter for screening mammogram for malignant neoplasm of breast: Secondary | ICD-10-CM

## 2012-03-14 DIAGNOSIS — C189 Malignant neoplasm of colon, unspecified: Secondary | ICD-10-CM

## 2012-03-14 DIAGNOSIS — Z0000 Encounter for general adult medical examination without abnormal findings: Secondary | ICD-10-CM

## 2012-03-14 DIAGNOSIS — M2560 Stiffness of unspecified joint, not elsewhere classified: Secondary | ICD-10-CM

## 2012-03-14 DIAGNOSIS — M6289 Other specified disorders of muscle: Secondary | ICD-10-CM

## 2012-03-14 DIAGNOSIS — M629 Disorder of muscle, unspecified: Secondary | ICD-10-CM

## 2012-03-14 LAB — LIPID PANEL
Cholesterol: 207 mg/dL — ABNORMAL HIGH (ref 0–200)
LDL Cholesterol: 108 mg/dL — ABNORMAL HIGH (ref 0–99)
Total CHOL/HDL Ratio: 2.5 Ratio
VLDL: 15 mg/dL (ref 0–40)

## 2012-03-14 NOTE — Progress Notes (Signed)
Patient ID: Diane Erickson, female   DOB: [birth year], 51 y.o.   MRN: [id number]  Subjective:   Patient ID: Diane Erickson female   DOB: [birth year] 51 y.o.   MRN: [id number]  HPI: [name] is a 51 y.o.  female with PMH of iron deficiency anemia and colon cancer who presents to the clinic for followup visit. Patient reports that she has had surgery for colon cancer in 2011, and she follows with Oncologist Dr. Dalene Carrow.   She states that she feels ok except for occasional muscle stiffness noted. No weakness, numbness or tingling. No muscle pain.   Health maintenance 1. Pap smear next visit 2. Referral to mammogram     Past Medical History  Diagnosis Date  . Colon cancer     colon ca dx 8/11   Current Outpatient Prescriptions  Medication Sig Dispense Refill  . lidocaine-prilocaine (EMLA) cream Apply topically as needed.  60 g  1  . ferrous sulfate 325 (65 FE) MG tablet Take 1 tablet (325 mg total) by mouth 3 (three) times daily with meals.  90 tablet  5  . HYDROcodone-acetaminophen (NORCO) 5-325 MG per tablet Take 1 tablet by mouth every 6 (six) hours as needed.         Family History  Problem Relation Age of Onset  . Cancer Mother     lung cancer  . Colon cancer Neg Hx    History   Social History  . Marital Status: Single    Spouse Name: N/A    Number of Children: N/A  . Years of Education: N/A   Occupational History  .      works at [employer] as a Production assistant, radio in Health and safety inspector   Social History Main Topics  . Smoking status: Former Games developer  . Smokeless tobacco: None  . Alcohol Use: Yes     Comment: occasional alcohol, wine  . Drug Use: No  . Sexually Active: Yes    Birth Control/ Protection: Surgical   Other Topics Concern  . None   Social History Narrative   Patient lives alone and with her 2 children( 20 years and 85 years old). Sexually active iwthon epartner, does not use condom.   Review of Systems: Review of Systems:  Constitutional:  Denies fever,  chills, diaphoresis, appetite change and fatigue.   HEENT:  Denies congestion, sore throat, rhinorrhea, sneezing, mouth sores, trouble swallowing, neck pain   Respiratory:  Denies SOB, DOE, cough, and wheezing.   Cardiovascular:  Denies palpitations and leg swelling.   Gastrointestinal:  Denies nausea, vomiting, abdominal pain, diarrhea, constipation, blood in stool and abdominal distention.   Genitourinary:  Denies dysuria, urgency, frequency, hematuria, flank pain and difficulty urinating.   Musculoskeletal:  Denies myalgias, back pain, joint swelling, arthralgias and gait problem.   Skin:  Denies pallor, rash and wound.   Neurological:  Denies dizziness, seizures, syncope, weakness, light-headedness, numbness and headaches.    .   Objective:  Physical Exam: Filed Vitals:   03/14/12 1539  BP: 134/81  Pulse: 78  Temp: 97.9 F (36.6 C)  TempSrc: Oral  Height: 5\' 6"  (1.676 m)  Weight: 159 lb 4.8 oz (72.258 kg)  SpO2: 100%   General: alert, well-developed, and cooperative to examination.  Head: normocephalic and atraumatic.  Eyes: vision grossly intact, pupils equal, pupils round, pupils reactive to light, no injection and anicteric.  Mouth: pharynx pink and moist, no erythema, and no exudates.  Neck: supple, full ROM, no thyromegaly, no JVD, and no  carotid bruits.  Lungs: normal respiratory effort, no accessory muscle use, normal breath sounds, no crackles, and no wheezes. Heart: normal rate, regular rhythm, no murmur, no gallop, and no rub.  Abdomen: soft, non-tender, normal bowel sounds, no distention, no guarding, no rebound tenderness, no hepatomegaly, and no splenomegaly.  Msk: no joint swelling, no joint warmth, and no redness over joints.  Pulses: 2+ DP/PT pulses bilaterally Extremities: No cyanosis, clubbing, edema Neurologic: alert & oriented X3, cranial nerves II-XII intact, strength normal in all extremities, sensation intact to light touch, and gait normal.  Skin:  turgor normal and no rashes.  Psych: Oriented X3, memory intact for recent and remote, normally interactive, good eye contact, not anxious appearing, and not depressed appearing.   Assessment & Plan:

## 2012-03-14 NOTE — Patient Instructions (Addendum)
1. Follow up in Feb for pap smear 30 minutes appt 2. Referral mammogram 3. Check your lipid panel and TSH

## 2012-03-16 DIAGNOSIS — M6289 Other specified disorders of muscle: Secondary | ICD-10-CM | POA: Insufficient documentation

## 2012-03-16 NOTE — Assessment & Plan Note (Signed)
Occasional muscle stiffness. No pain, weakness, numbness or tingling. No other c/o. Active exercise daily. She is biker. No appetite or weight changes  - Unsure the etiology of her muscle stiffness. No focal neuro deficit. May related to her colon cancer and treatment. Will check TSH and lipid panel. - will monitor, and patient is instructed to call if she had consistent symptoms.

## 2012-03-27 ENCOUNTER — Ambulatory Visit (HOSPITAL_BASED_OUTPATIENT_CLINIC_OR_DEPARTMENT_OTHER): Payer: Medicaid Other

## 2012-03-27 ENCOUNTER — Encounter: Payer: Self-pay | Admitting: Hematology and Oncology

## 2012-03-27 VITALS — BP 161/84 | HR 77 | Temp 97.7°F

## 2012-03-27 DIAGNOSIS — Z452 Encounter for adjustment and management of vascular access device: Secondary | ICD-10-CM

## 2012-03-27 DIAGNOSIS — C189 Malignant neoplasm of colon, unspecified: Secondary | ICD-10-CM

## 2012-03-27 MED ORDER — SODIUM CHLORIDE 0.9 % IJ SOLN
10.0000 mL | INTRAMUSCULAR | Status: DC | PRN
  Administered 2012-03-27: 10 mL via INTRAVENOUS
  Filled 2012-03-27: qty 10

## 2012-03-27 MED ORDER — HEPARIN SOD (PORK) LOCK FLUSH 100 UNIT/ML IV SOLN
500.0000 [IU] | Freq: Once | INTRAVENOUS | Status: AC
  Administered 2012-03-27: 500 [IU] via INTRAVENOUS
  Filled 2012-03-27: qty 5

## 2012-03-27 NOTE — Progress Notes (Signed)
Patient came in wanting gas card. I gave her application for assistance and advised to get back to me. It is her and her kids and she gets Tree surgeon. Advised her all needed to send back with application.

## 2012-03-27 NOTE — Patient Instructions (Signed)
Call MD if any questions 

## 2012-04-12 ENCOUNTER — Ambulatory Visit
Admission: RE | Admit: 2012-04-12 | Discharge: 2012-04-12 | Disposition: A | Discharge status: Home / Self Care | Payer: Medicaid Other | Source: Ambulatory Visit | Attending: Internal Medicine | Admitting: Internal Medicine

## 2012-04-12 DIAGNOSIS — Z1231 Encounter for screening mammogram for malignant neoplasm of breast: Secondary | ICD-10-CM

## 2012-05-15 ENCOUNTER — Other Ambulatory Visit (HOSPITAL_BASED_OUTPATIENT_CLINIC_OR_DEPARTMENT_OTHER): Payer: Medicaid Other | Admitting: Lab

## 2012-05-15 ENCOUNTER — Ambulatory Visit (HOSPITAL_BASED_OUTPATIENT_CLINIC_OR_DEPARTMENT_OTHER): Payer: Medicaid Other

## 2012-05-15 VITALS — BP 116/82 | HR 72 | Temp 97.0°F | Resp 20

## 2012-05-15 DIAGNOSIS — C184 Malignant neoplasm of transverse colon: Secondary | ICD-10-CM

## 2012-05-15 DIAGNOSIS — C189 Malignant neoplasm of colon, unspecified: Secondary | ICD-10-CM

## 2012-05-15 DIAGNOSIS — Z452 Encounter for adjustment and management of vascular access device: Secondary | ICD-10-CM

## 2012-05-15 LAB — COMPREHENSIVE METABOLIC PANEL (CC13)
Albumin: 3.9 g/dL (ref 3.5–5.0)
Alkaline Phosphatase: 43 U/L (ref 40–150)
BUN: 11 mg/dL (ref 7.0–26.0)
Calcium: 9.4 mg/dL (ref 8.4–10.4)
Glucose: 112 mg/dl — ABNORMAL HIGH (ref 70–99)
Potassium: 4 mEq/L (ref 3.5–5.1)

## 2012-05-15 LAB — CBC WITH DIFFERENTIAL/PLATELET
BASO%: 0.3 % (ref 0.0–2.0)
HCT: 41.8 % (ref 34.8–46.6)
LYMPH%: 37.1 % (ref 14.0–49.7)
MCH: 30.3 pg (ref 25.1–34.0)
MCHC: 33.3 g/dL (ref 31.5–36.0)
MCV: 91.1 fL (ref 79.5–101.0)
MONO#: 0.4 10*3/uL (ref 0.1–0.9)
NEUT%: 54.5 % (ref 38.4–76.8)
Platelets: 221 10*3/uL (ref 145–400)
WBC: 5.8 10*3/uL (ref 3.9–10.3)

## 2012-05-15 MED ORDER — HEPARIN SOD (PORK) LOCK FLUSH 100 UNIT/ML IV SOLN
500.0000 [IU] | Freq: Once | INTRAVENOUS | Status: DC
  Filled 2012-05-15: qty 5

## 2012-05-15 MED ORDER — HEPARIN SOD (PORK) LOCK FLUSH 100 UNIT/ML IV SOLN
500.0000 [IU] | Freq: Once | INTRAVENOUS | Status: AC
  Administered 2012-05-15: 500 [IU] via INTRAVENOUS
  Filled 2012-05-15: qty 5

## 2012-05-15 MED ORDER — SODIUM CHLORIDE 0.9 % IJ SOLN
10.0000 mL | INTRAMUSCULAR | Status: DC | PRN
  Filled 2012-05-15: qty 10

## 2012-05-15 MED ORDER — SODIUM CHLORIDE 0.9 % IJ SOLN
10.0000 mL | INTRAMUSCULAR | Status: DC | PRN
  Administered 2012-05-15: 10 mL via INTRAVENOUS
  Filled 2012-05-15: qty 10

## 2012-05-17 ENCOUNTER — Ambulatory Visit (HOSPITAL_BASED_OUTPATIENT_CLINIC_OR_DEPARTMENT_OTHER): Payer: Medicaid Other | Admitting: Family

## 2012-05-17 ENCOUNTER — Encounter: Payer: Self-pay | Admitting: Family

## 2012-05-17 VITALS — BP 132/89 | HR 77 | Temp 97.6°F | Resp 20 | Ht 66.0 in | Wt 155.1 lb

## 2012-05-17 DIAGNOSIS — C778 Secondary and unspecified malignant neoplasm of lymph nodes of multiple regions: Secondary | ICD-10-CM

## 2012-05-17 DIAGNOSIS — C189 Malignant neoplasm of colon, unspecified: Secondary | ICD-10-CM

## 2012-05-17 DIAGNOSIS — R109 Unspecified abdominal pain: Secondary | ICD-10-CM

## 2012-05-17 MED ORDER — LIDOCAINE-PRILOCAINE 2.5-2.5 % EX CREA: TOPICAL_CREAM | CUTANEOUS | Status: DC | PRN

## 2012-05-17 NOTE — Patient Instructions (Signed)
Please contact us at [phone] if you have any questions or concerns. 

## 2012-05-17 NOTE — Progress Notes (Signed)
Patient ID: Diane Erickson, female   DOB: May 23, 1960, 52 y.o.   MRN: [id number] CSN: [id number]  CC: Diane Query, MD  Velora Heckler, MD  Problem List: Diane Erickson is a 52 y.o. African-American female with a problem list consisting of:  1.  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 She is status post 6 months of adjuvant FOLFOX 6 with Neulasta support from 01/02/2010 through 07/22/2010. Residual peripheral neuropathy 2. Left chest Port-A-Cath 3. Internal hemorrhoids  [name] was seen as a new patient today transferring care from Dr. Dalene Carrow today.  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 She is status post 6 months of adjuvant FOLFOX 6 with Neulasta support from 01/02/2010 through 07/22/2010. She had some post treatment neuropathy which has resolved.  Dr. Dalene Carrow last saw [name] on 11/22/2011.   Since her last office visit  [name] states that she has ongoing abdominal pain. She also reports one isolated incident of lower back pain within the last week, that has since resolved.  She denies any other symptomatology including any unusual bleeding, nausea/vomiting/diarrhea/constipation, fever or chills.     Past Medical History: Past Medical History  Diagnosis Date  . Colon cancer     colon ca dx 8/11    Surgical History: Past Surgical History  Procedure Date  . Colon surgery     left colectomy, distal pancreatectomy, splenectomy  . Tubal ligation     1997    Current Medications: Current Outpatient Prescriptions  Medication Sig Dispense Refill  . lidocaine-prilocaine (EMLA) cream Apply topically as needed.  60 g  1    Allergies: No Known Allergies  Family History: Family History  Problem  Relation Age of Onset  . Cancer Mother     lung cancer  . Colon cancer Neg Hx     Social History: History  Substance Use Topics  . Smoking status: Former Games developer  . Smokeless tobacco: Not on file  . Alcohol Use: Yes     Comment: occasional alcohol, wine    Review of Systems: 10 Point review of systems was completed and is negative except as noted above.   Physical Exam:   Blood pressure 132/89, pulse 77, temperature 97.6 F (36.4 C), temperature source Oral, resp. rate 20, height 5\' 6"  (1.676 m), weight 155 lb 1.6 oz (70.353 kg).  General appearance: Alert, cooperative, well nourished, no apparent distress Head: Normocephalic, without obvious abnormality, atraumatic Eyes: Conjunctivae/corneas clear, PERRLA, EOMI Nose: Nares, septum and mucosa are normal, no drainage or sinus tenderness Neck: No adenopathy, supple, symmetrical, trachea midline, thyroid not enlarged, no tenderness Resp: Clear to auscultation bilaterally Cardio: Regular rate and rhythm, S1, S2 normal, no murmur, click, rub or gallop, left chest Port-A-Cath without signs of infection GI: Soft, distended, non-tender when palpated, hypoactive bowel sounds, no organomegaly Extremities: Extremities normal, atraumatic, no cyanosis or edema Lymph nodes: Cervical, supraclavicular, and axillary nodes normal Neurologic: Grossly normal   Laboratory Data: Recent Results (from the past 168 hour(s))  CBC WITH DIFFERENTIAL   Collection Time   05/15/12  9:19 AM      Component Value Range   WBC 5.8  3.9 - 10.3 10e3/uL   NEUT# 3.1  1.5 - 6.5 10e3/uL   HGB 13.9  11.6 - 15.9 g/dL   HCT 16.1  09.6 - 04.5 %   Platelets 221  145 - 400 10e3/uL   MCV 91.1  79.5 - 101.0 fL   MCH 30.3  25.1 - 34.0 pg   MCHC 33.3  31.5 - 36.0 g/dL   RBC 4.09  8.11 - 9.14 10e6/uL   RDW 15.3 (*) 11.2 - 14.5 %   lymph# 2.1  0.9 - 3.3 10e3/uL   MONO# 0.4  0.1 - 0.9 10e3/uL   Eosinophils Absolute 0.1  0.0 - 0.5 10e3/uL   Basophils Absolute 0.0  0.0 -  0.1 10e3/uL   NEUT% 54.5  38.4 - 76.8 %   LYMPH% 37.1  14.0 - 49.7 %   MONO% 7.1  0.0 - 14.0 %   EOS% 1.0  0.0 - 7.0 %   BASO% 0.3  0.0 - 2.0 %   nRBC 0  0 - 0 %  CEA   Collection Time   05/15/12  9:19 AM      Component Value Range   CEA 3.3  0.0 - 5.0 ng/mL  COMPREHENSIVE METABOLIC PANEL (CC13)   Collection Time   05/15/12  9:19 AM      Component Value Range   Sodium 141  136 - 145 mEq/L   Potassium 4.0  3.5 - 5.1 mEq/L   Chloride 106  98 - 107 mEq/L   CO2 25  22 - 29 mEq/L   Glucose 112 (*) 70 - 99 mg/dl   BUN 78.2  7.0 - 95.6 mg/dL   Creatinine 0.6  0.6 - 1.1 mg/dL   Total Bilirubin 2.13  0.20 - 1.20 mg/dL   Alkaline Phosphatase 43  40 - 150 U/L   AST 16  5 - 34 U/L   ALT 6  0 - 55 U/L   Total Protein 7.4  6.4 - 8.3 g/dL   Albumin 3.9  3.5 - 5.0 g/dL   Calcium 9.4  8.4 - 08.6 mg/dL    Procedures:   1.  Colonoscopy at [facility] in [place], [place] on 05/04/2011 showed evidence of a previous side to side colo-colonic anastomosis was seen in the distal descending colon at 35 cm. Small non-bleeding internal hemorrhoids were noted. Plan: Colonoscopy in 3 years.   Imaging Studies: 1.  CT of the abdomen and pelvis with contrast on 04/19/2011 showed no evidence of recurrent or metastatic disease in the  abdomen/pelvis.  2.  CT of the chest with contrast on 04/19/2011 showed no evidence of metastatic disease in the chest. 3.  Digital screening mammogram on 04/14/2012 showed no mammographic evidence of malignancy.   Impression/Plan: 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 She is status post 6 months of adjuvant FOLFOX 6 with Neulasta support from 01/02/2010 through 07/22/2010.  When I left the room to retrieve Dr. Arline Asp to introduce him to the patient, Ms. McClain had left the  appointment without any notice.  I called her home number, no one answered nor was there an answering machine.  I called Diane Erickson (unknown relation to the patient) at [phone] as he is listed on the patient's Facesheet.  I spoke to Mr. McClain twice today and asked him to have Ms. Natale Barba return my call.  Received a message today from the scheduling department that Ms. McClain called  in requested to be rescheduled with me on 05/20/2012.   Dr. Arline Asp would like the patient to complete a chest x-ray, labs and CT of the abdomen and pelvis as her last imaging studies are over 36 year old and the patient had abdominal pain complaints during today's visit.  CT of the abdomen/pelvis with contrast, CXR, CMP, CBC, LDH and CEA have all been ordered for 05/20/2012.     Dr. Arline Asp and I performed and extensive review and record retrieval of this patient's records in preparation for her office visit today.  The patient did have 30 minutes face to face time for today's office appointment.    Larina Bras, NP-C 05/17/2012, 11:08 AM

## 2012-05-20 ENCOUNTER — Encounter: Payer: Self-pay | Admitting: Family

## 2012-05-20 ENCOUNTER — Ambulatory Visit (HOSPITAL_COMMUNITY)
Admission: RE | Admit: 2012-05-20 | Discharge: 2012-05-20 | Disposition: A | Discharge status: Home / Self Care | Payer: Medicaid Other | Source: Ambulatory Visit | Attending: Family | Admitting: Family

## 2012-05-20 ENCOUNTER — Telehealth: Payer: Self-pay | Admitting: Oncology

## 2012-05-20 ENCOUNTER — Ambulatory Visit (HOSPITAL_BASED_OUTPATIENT_CLINIC_OR_DEPARTMENT_OTHER): Payer: Medicaid Other | Admitting: Family

## 2012-05-20 ENCOUNTER — Other Ambulatory Visit (HOSPITAL_BASED_OUTPATIENT_CLINIC_OR_DEPARTMENT_OTHER): Payer: Medicaid Other | Admitting: Lab

## 2012-05-20 VITALS — BP 115/75 | HR 79 | Temp 97.5°F | Resp 20 | Ht 66.0 in | Wt 155.0 lb

## 2012-05-20 DIAGNOSIS — Z85038 Personal history of other malignant neoplasm of large intestine: Secondary | ICD-10-CM | POA: Insufficient documentation

## 2012-05-20 DIAGNOSIS — J984 Other disorders of lung: Secondary | ICD-10-CM | POA: Insufficient documentation

## 2012-05-20 DIAGNOSIS — C189 Malignant neoplasm of colon, unspecified: Secondary | ICD-10-CM

## 2012-05-20 DIAGNOSIS — D509 Iron deficiency anemia, unspecified: Secondary | ICD-10-CM

## 2012-05-20 DIAGNOSIS — Z87891 Personal history of nicotine dependence: Secondary | ICD-10-CM | POA: Insufficient documentation

## 2012-05-20 DIAGNOSIS — C778 Secondary and unspecified malignant neoplasm of lymph nodes of multiple regions: Secondary | ICD-10-CM

## 2012-05-20 DIAGNOSIS — Z9221 Personal history of antineoplastic chemotherapy: Secondary | ICD-10-CM | POA: Insufficient documentation

## 2012-05-20 DIAGNOSIS — R1084 Generalized abdominal pain: Secondary | ICD-10-CM | POA: Insufficient documentation

## 2012-05-20 DIAGNOSIS — N281 Cyst of kidney, acquired: Secondary | ICD-10-CM | POA: Insufficient documentation

## 2012-05-20 LAB — CBC WITH DIFFERENTIAL/PLATELET
Basophils Absolute: 0 10*3/uL (ref 0.0–0.1)
Eosinophils Absolute: 0.1 10*3/uL (ref 0.0–0.5)
HGB: 14.6 g/dL (ref 11.6–15.9)
MONO#: 0.7 10*3/uL (ref 0.1–0.9)
NEUT#: 3.6 10*3/uL (ref 1.5–6.5)
RDW: 14.7 % — ABNORMAL HIGH (ref 11.2–14.5)
lymph#: 2.7 10*3/uL (ref 0.9–3.3)

## 2012-05-20 LAB — LACTATE DEHYDROGENASE (CC13): LDH: 148 U/L (ref 125–245)

## 2012-05-20 LAB — COMPREHENSIVE METABOLIC PANEL (CC13)
Albumin: 3.9 g/dL (ref 3.5–5.0)
CO2: 27 mEq/L (ref 22–29)
Glucose: 96 mg/dl (ref 70–99)
Potassium: 4.2 mEq/L (ref 3.5–5.1)
Sodium: 136 mEq/L (ref 136–145)
Total Protein: 7.9 g/dL (ref 6.4–8.3)

## 2012-05-20 MED ORDER — IOHEXOL 300 MG/ML  SOLN
100.0000 mL | Freq: Once | INTRAMUSCULAR | Status: AC | PRN
  Administered 2012-05-20: 100 mL via INTRAVENOUS

## 2012-05-20 NOTE — Patient Instructions (Addendum)
Please contact us at [phone] if you have any questions or concerns. 

## 2012-05-20 NOTE — Telephone Encounter (Signed)
gv and printed appt schedule for pt for March adn April ...the patient aware

## 2012-05-20 NOTE — Telephone Encounter (Signed)
Gave pt oral contrast today

## 2012-05-20 NOTE — Progress Notes (Signed)
Patient ID: Diane Erickson, female   DOB: [birth year], 52 y.o.   MRN: [id number] CSN: [id number]  CC: Diane Query, MD  Velora Heckler, MD  Problem List: Diane Erickson is a 52 y.o. African-American female with a problem list consisting of:  1.  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 She is status post 6 months of adjuvant FOLFOX 6 with Neulasta support from 01/02/2010 through 07/22/2010. Residual peripheral neuropathy 2. Left chest Port-A-Cath - flushed on 05/15/2012 3. Internal hemorrhoids  Diane Erickson was seen again as a new patient today transferring care from Dr. Dalene Carrow.  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 She is status post 6 months of adjuvant FOLFOX 6 with Neulasta support from 01/02/2010 through 07/22/2010. She had some post treatment neuropathy which has resolved.  We last saw Diane Erickson on 05/17/2012 when she without notice, left the examination room.  She returns today to complete the office visit and continue her new patient evaluation.  Since her last office visit  Diane Erickson states that she has ongoing abdominal pain.  She denies any other symptomatology including any unusual bleeding, nausea/vomiting/diarrhea/constipation, fever or chills.      Past Medical History: Past Medical History  Diagnosis Date  . Colon cancer     colon ca dx 8/11    Surgical History: Past Surgical History  Procedure Date  . Colon surgery     left colectomy, distal pancreatectomy, splenectomy  . Tubal ligation     1997    Current Medications: Current Outpatient Prescriptions  Medication Sig Dispense Refill  . lidocaine-prilocaine (EMLA) cream Apply topically as needed.  60 g  1    Allergies: No Known  Allergies  Family History: Family History  Problem Relation Age of Onset  . Cancer Mother     lung cancer  . Colon cancer Neg Hx     Social History: History  Substance Use Topics  . Smoking status: Former Games developer  . Smokeless tobacco: Never Used  . Alcohol Use: Yes     Comment: occasional alcohol, wine    Review of Systems: 10 Point review of systems was completed and is negative except as noted above.   Physical Exam:   Blood pressure 115/75, pulse 79, temperature 97.5 F (36.4 C), temperature source Oral, resp. rate 20, height 5\' 6"  (1.676 m), weight 155 lb (70.308 kg), last menstrual period 04/21/2012.  General appearance: Alert, cooperative, well nourished, no apparent distress Head: Normocephalic, without obvious abnormality, atraumatic Eyes: Conjunctivae/corneas clear, PERRLA, EOMI Nose: Nares, septum and mucosa are normal, no drainage or sinus tenderness Neck: No adenopathy, supple, symmetrical, trachea midline, thyroid not enlarged, no tenderness Resp: Clear to auscultation bilaterally Cardio: Regular rate and rhythm, S1, S2 normal, no murmur, click, rub or gallop, left chest Port-A-Cath without signs of infection GI: Soft, distended, non-tender when palpated, hypoactive bowel sounds, no organomegaly Extremities: Extremities normal, atraumatic, no cyanosis or edema Lymph nodes: Cervical, supraclavicular, and axillary nodes normal Neurologic: Grossly normal   Laboratory Data: Recent Results (from the past 168 hour(s))  CBC WITH DIFFERENTIAL   Collection Time   05/15/12  9:19 AM      Component Value Range   WBC 5.8  3.9 - 10.3 10e3/uL   NEUT# 3.1  1.5 - 6.5 10e3/uL   HGB 13.9  11.6 - 15.9 g/dL   HCT 16.1  09.6 - 04.5 %   Platelets 221  145 - 400 10e3/uL   MCV 91.1  79.5 - 101.0 fL   MCH 30.3  25.1 - 34.0 pg   MCHC 33.3  31.5 - 36.0 g/dL   RBC 4.09  8.11 - 9.14 10e6/uL   RDW 15.3 (*) 11.2 - 14.5 %   lymph# 2.1  0.9 - 3.3 10e3/uL   MONO# 0.4  0.1 - 0.9 10e3/uL    Eosinophils Absolute 0.1  0.0 - 0.5 10e3/uL   Basophils Absolute 0.0  0.0 - 0.1 10e3/uL   NEUT% 54.5  38.4 - 76.8 %   LYMPH% 37.1  14.0 - 49.7 %   MONO% 7.1  0.0 - 14.0 %   EOS% 1.0  0.0 - 7.0 %   BASO% 0.3  0.0 - 2.0 %   nRBC 0  0 - 0 %  CEA   Collection Time   05/15/12  9:19 AM      Component Value Range   CEA 3.3  0.0 - 5.0 ng/mL  COMPREHENSIVE METABOLIC PANEL (CC13)   Collection Time   05/15/12  9:19 AM      Component Value Range   Sodium 141  136 - 145 mEq/L   Potassium 4.0  3.5 - 5.1 mEq/L   Chloride 106  98 - 107 mEq/L   CO2 25  22 - 29 mEq/L   Glucose 112 (*) 70 - 99 mg/dl   BUN 78.2  7.0 - 95.6 mg/dL   Creatinine 0.6  0.6 - 1.1 mg/dL   Total Bilirubin 2.13  0.20 - 1.20 mg/dL   Alkaline Phosphatase 43  40 - 150 U/L   AST 16  5 - 34 U/L   ALT 6  0 - 55 U/L   Total Protein 7.4  6.4 - 8.3 g/dL   Albumin 3.9  3.5 - 5.0 g/dL   Calcium 9.4  8.4 - 08.6 mg/dL  CBC WITH DIFFERENTIAL   Collection Time   05/20/12  2:53 PM      Component Value Range   WBC 7.1  3.9 - 10.3 10e3/uL   NEUT# 3.6  1.5 - 6.5 10e3/uL   HGB 14.6  11.6 - 15.9 g/dL   HCT 57.8  46.9 - 62.9 %   Platelets 220  145 - 400 10e3/uL   MCV 91.9  79.5 - 101.0 fL   MCH 30.7  25.1 - 34.0 pg   MCHC 33.4  31.5 - 36.0 g/dL   RBC 5.28  4.13 - 2.44 10e6/uL   RDW 14.7 (*) 11.2 - 14.5 %   lymph# 2.7  0.9 - 3.3 10e3/uL   MONO# 0.7  0.1 - 0.9 10e3/uL   Eosinophils Absolute 0.1  0.0 - 0.5 10e3/uL   Basophils Absolute 0.0  0.0 - 0.1 10e3/uL   NEUT% 50.7  38.4 - 76.8 %   LYMPH% 37.4  14.0 - 49.7 %   MONO% 9.8  0.0 - 14.0 %   EOS% 1.6  0.0 - 7.0 %   BASO% 0.5  0.0 - 2.0 %  COMPREHENSIVE METABOLIC PANEL (CC13)   Collection Time   05/20/12  2:53 PM      Component Value Range   Sodium 136  136 - 145 mEq/L   Potassium 4.2  3.5 - 5.1 mEq/L   Chloride 103  98 - 107 mEq/L   CO2 27  22 - 29 mEq/L   Glucose 96  70 - 99 mg/dl   BUN 16.1  7.0 - 09.6 mg/dL   Creatinine 0.7  0.6 - 1.1 mg/dL   Total Bilirubin 0.45  0.20 -  1.20 mg/dL   Alkaline Phosphatase 47  40 - 150 U/L   AST 14  5 - 34 U/L   ALT 7  0 - 55 U/L   Total Protein 7.9  6.4 - 8.3 g/dL   Albumin 3.9  3.5 - 5.0 g/dL   Calcium 9.7  8.4 - 40.9 mg/dL  LACTATE DEHYDROGENASE (CC13)   Collection Time   05/20/12  2:53 PM      Component Value Range   LDH 148  125 - 245 U/L    Procedures:   1.  Colonoscopy at [facility] in [place], [place] on 05/04/2011 showed evidence of a previous side to side colo-colonic anastomosis was seen in the distal descending colon at 35 cm. Small non-bleeding internal hemorrhoids were noted. Plan: Colonoscopy in 3 years.   Imaging Studies: 1.  CT of the abdomen and pelvis with contrast on 04/19/2011 showed no evidence of recurrent or metastatic disease in the  abdomen/pelvis.  2.  CT of the chest with contrast on 04/19/2011 showed no evidence of metastatic disease in the chest. 3.  Digital screening mammogram on 04/14/2012 showed no mammographic evidence of malignancy. 4.  Chest x-ray, 2 view on 05/20/2012 showed no acute cardiopulmonary disease.  No pulmonary metastatic disease by conventional radiography.    5.  CT of the abdomen and pelvis with contrast on 05/20/2012 showed lung bases demonstrate minimal curvilinear bibasilar scarring.  Liver, gallbladder, pancreas, and adrenal glands are normal. Bilateral upper renal pole cortical cysts are noted.  No hydronephrosis.  An accessory/collateral vessel is again noted at the superior left renal pole, extending inferiorly to join with retroperitoneal vessels and likely renal vein.  No hydronephrosis. Evidence of splenectomy and distal pancreatectomy again noted.  No lymphadenopathy, ascites, or free air.  Mass-like prominence of the right adnexa is new since prior study.  Trace right adnexal free fluid is identified.  Uterus and left ovary are normal.  Evidence of partial left hemicolectomy without recurrence at the anastomoses.  No bowel wall thickening or focal  segmental dilatation.  Appendix not visualized.  No evidence for appendicitis.  No lytic or sclerotic osseous lesion. A new mass-like prominence of the right adnexa.  Given the presence of trace adjacent free fluid, this could represent a hemorrhagic cyst.  However, metastatic disease could have a similar appearance given the history of a prior gastrointestinal malignancy. Pelvic ultrasound is recommended for further evaluation.   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 She is status post 6 months of adjuvant FOLFOX 6 with Neulasta support from 01/02/2010 through 07/22/2010.  Diane Erickson completed a chest x-ray, labs and CT of the abdomen and pelvis today.  The CT of the abdomen/pelvis showed a new mass-like prominence of the right adnexa.  US of the pelvis ordered today as recommended by the CT results.   I called Diane Erickson to give her the results of the CT scan and to schedule an abdomen/pelvis US with her.  No answer or answering machine at her home/cell number.  We will attempt to reach her again tomorrow.  We  have tentatively scheduled a follow up visit for Diane Erickson on 09/02/2012 with laboratories of CMP, CBC, LDH and CEA to be drawn at that time. Her Port-A-Cath is scheduled to be flushed in 8 weeks on 07/08/2012 and then again during her office visit on 09/03/2011. A prescription for EMLA cream was provided to Diane Erickson.  Diane Erickson was encouraged to contact us in the interim with any questions or concerns.       Larina Bras, NP-C 05/20/2012, 6:08 PM

## 2012-05-21 ENCOUNTER — Telehealth: Payer: Self-pay

## 2012-05-21 NOTE — Telephone Encounter (Signed)
S/w pt that the CT results are back and we need to do an [place] of pelvis. I told pt to expect a call from the schedulers.

## 2012-05-23 ENCOUNTER — Encounter: Payer: Self-pay | Admitting: Oncology

## 2012-05-23 NOTE — Progress Notes (Signed)
The CT scans from 05/20/2012 were reviewed. The mass seen in the right adnexa seems to be an enlarging pedunculated subserosal fibroid that was present going back to the CT scan from 12/15/2009. In addition there is also a mass in the left kidney which was also present on the CT scan of 12/15/2009 and shows no apparent change.` This may be a vascular malformation. A renal cell carcinoma is felt to be less likely given the stability of this abnormality over the past 2 and half years.

## 2012-05-24 ENCOUNTER — Telehealth: Payer: Self-pay | Admitting: Medical Oncology

## 2012-05-24 NOTE — Telephone Encounter (Signed)
I called pt to inform her that Dr. Arline Asp is cancelling the ultrasound. Dr. Arline Asp and Dr. Chilton Si reviewed her CT and prior CT's and they do not feel the mass (right adnexal ) is cancer. I informed her not to come Monday 05/27/12 for the ultrasound. She voiced understanding. Ultrasound cancelled.

## 2012-05-27 ENCOUNTER — Ambulatory Visit (HOSPITAL_COMMUNITY): Payer: Medicaid Other

## 2012-06-27 ENCOUNTER — Encounter: Payer: Self-pay | Admitting: Internal Medicine

## 2012-06-27 ENCOUNTER — Other Ambulatory Visit (HOSPITAL_COMMUNITY)
Admission: RE | Admit: 2012-06-27 | Discharge: 2012-06-27 | Disposition: A | Discharge status: Home / Self Care | Payer: Medicaid Other | Source: Ambulatory Visit | Attending: Internal Medicine | Admitting: Internal Medicine

## 2012-06-27 ENCOUNTER — Ambulatory Visit (INDEPENDENT_AMBULATORY_CARE_PROVIDER_SITE_OTHER): Payer: Medicaid Other | Admitting: Internal Medicine

## 2012-06-27 VITALS — BP 118/81 | HR 74 | Temp 97.1°F | Ht 66.0 in | Wt 159.4 lb

## 2012-06-27 DIAGNOSIS — Z01419 Encounter for gynecological examination (general) (routine) without abnormal findings: Secondary | ICD-10-CM | POA: Insufficient documentation

## 2012-06-27 DIAGNOSIS — D509 Iron deficiency anemia, unspecified: Secondary | ICD-10-CM

## 2012-06-27 DIAGNOSIS — C189 Malignant neoplasm of colon, unspecified: Secondary | ICD-10-CM

## 2012-06-27 DIAGNOSIS — Z1151 Encounter for screening for human papillomavirus (HPV): Secondary | ICD-10-CM | POA: Insufficient documentation

## 2012-06-27 NOTE — Assessment & Plan Note (Signed)
Resolved. Oncologist follows up and Lab done in Jan 2014.

## 2012-06-27 NOTE — Progress Notes (Signed)
Patient ID: Diane Erickson, female   DOB: 1960/05/31, 52 y.o.   MRN: [id number]  Subjective:   Patient ID: Diane Erickson female   DOB: July 03, 1960 52 y.o.   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 She is status post 6 months of adjuvant FOLFOX 6 with Neulasta support from 01/02/2010 through 07/22/2010.  she follows with Oncologist Dr. Dalene Carrow.   She is here for yearly follow up and Pap smear. No c/o.   Health maintenance 1. Pap smear done today    Past Medical History  Diagnosis Date  . Colon cancer     colon ca dx 8/11   Current Outpatient Prescriptions  Medication Sig Dispense Refill  . lidocaine-prilocaine (EMLA) cream Apply topically as needed.  60 g  1   No current facility-administered medications for this visit.   Family History  Problem Relation Age of Onset  . Cancer Mother     lung cancer  . Colon cancer Neg Hx    History   Social History  . Marital Status: Single    Spouse Name: N/A    Number of Children: N/A  . Years of Education: N/A   Occupational History  .      works at [employer] as a Production assistant, radio in Health and safety inspector   Social History Main Topics  . Smoking status: Former Games developer  . Smokeless tobacco: Never Used  . Alcohol Use: Yes     Comment: occasional alcohol, wine  . Drug Use: No  . Sexually Active: Yes    Birth Control/ Protection: Surgical   Other Topics Concern  . Not on file   Social History Narrative   Patient lives alone and with her 2 children( 20 years and 29 years old). Sexually active iwthon epartner, does not use condom.   Review of Systems: Review of Systems:  Constitutional:  Denies fever, chills, diaphoresis, appetite change and fatigue.   HEENT:  Denies congestion, sore throat, rhinorrhea, sneezing, mouth sores, trouble  swallowing, neck pain   Respiratory:  Denies SOB, DOE, cough, and wheezing.   Cardiovascular:  Denies palpitations and leg swelling.   Gastrointestinal:  Denies nausea, vomiting, abdominal pain, diarrhea, constipation, blood in stool and abdominal distention.   Genitourinary:  Denies dysuria, urgency, frequency, hematuria, flank pain and difficulty urinating.   Musculoskeletal:  Denies myalgias, back pain, joint swelling, arthralgias and gait problem.   Skin:  Denies pallor, rash and wound.   Neurological:  Denies dizziness, seizures, syncope, weakness, light-headedness, numbness and headaches.    .   Objective:  Physical Exam: There were no vitals filed for this visit. General: alert, well-developed, and cooperative to examination.  Head: normocephalic and atraumatic.  Eyes: vision grossly intact, pupils equal, pupils round, pupils reactive to light, no injection and anicteric.  Mouth: pharynx pink and moist, no erythema, and no exudates.  Neck: supple, full ROM, no thyromegaly, no JVD, and no carotid bruits.  Lungs: normal respiratory effort, no accessory muscle use, normal breath sounds, no crackles, and no wheezes. Heart: normal rate, regular rhythm, no murmur, no gallop, and no rub.  Abdomen: soft, non-tender, normal bowel sounds, no distention, no guarding, no rebound tenderness, no hepatomegaly, and no splenomegaly.  Msk: no joint swelling, no joint warmth, and no redness over joints.  Pulses: 2+ DP/PT pulses bilaterally Extremities: No  cyanosis, clubbing, edema Neurologic: alert & oriented X3, cranial nerves II-XII intact, strength normal in all extremities, sensation intact to light touch, and gait normal.  Skin: turgor normal and no rashes.  Psych: Oriented X3, memory intact for recent and remote, normally interactive, good eye contact, not anxious appearing, and not depressed appearing. GU: normal external genitalia, normal-appearing vaginal vault, and endo & exocervical Pap  obtained.  Assessment & Plan:

## 2012-06-27 NOTE — Assessment & Plan Note (Signed)
Tolerated Pap smear well.

## 2012-06-27 NOTE — Assessment & Plan Note (Signed)
Follows up with her Oncologist.

## 2012-06-27 NOTE — Patient Instructions (Addendum)
1. Follow up in 6 months.  

## 2012-07-08 ENCOUNTER — Ambulatory Visit (HOSPITAL_BASED_OUTPATIENT_CLINIC_OR_DEPARTMENT_OTHER): Payer: Medicaid Other

## 2012-07-08 VITALS — BP 148/98 | HR 79 | Temp 97.4°F | Resp 20

## 2012-07-08 DIAGNOSIS — C189 Malignant neoplasm of colon, unspecified: Secondary | ICD-10-CM

## 2012-07-08 MED ORDER — SODIUM CHLORIDE 0.9 % IJ SOLN
10.0000 mL | INTRAMUSCULAR | Status: DC | PRN
  Administered 2012-07-08: 10 mL via INTRAVENOUS
  Filled 2012-07-08: qty 10

## 2012-07-08 MED ORDER — HEPARIN SOD (PORK) LOCK FLUSH 100 UNIT/ML IV SOLN
500.0000 [IU] | Freq: Once | INTRAVENOUS | Status: AC
  Administered 2012-07-08: 500 [IU] via INTRAVENOUS
  Filled 2012-07-08: qty 5

## 2012-08-06 ENCOUNTER — Encounter: Payer: Self-pay | Admitting: Internal Medicine

## 2012-08-06 DIAGNOSIS — E785 Hyperlipidemia, unspecified: Secondary | ICD-10-CM | POA: Insufficient documentation

## 2012-08-28 ENCOUNTER — Other Ambulatory Visit: Payer: Self-pay | Admitting: *Deleted

## 2012-08-28 DIAGNOSIS — C189 Malignant neoplasm of colon, unspecified: Secondary | ICD-10-CM

## 2012-08-28 MED ORDER — LIDOCAINE-PRILOCAINE 2.5-2.5 % EX CREA: TOPICAL_CREAM | CUTANEOUS | Status: DC

## 2012-09-02 ENCOUNTER — Telehealth: Payer: Self-pay | Admitting: Oncology

## 2012-09-02 ENCOUNTER — Ambulatory Visit: Payer: Medicaid Other

## 2012-09-02 ENCOUNTER — Other Ambulatory Visit (HOSPITAL_BASED_OUTPATIENT_CLINIC_OR_DEPARTMENT_OTHER): Payer: Medicaid Other | Admitting: Lab

## 2012-09-02 ENCOUNTER — Ambulatory Visit (HOSPITAL_BASED_OUTPATIENT_CLINIC_OR_DEPARTMENT_OTHER): Payer: Medicaid Other | Admitting: Oncology

## 2012-09-02 ENCOUNTER — Encounter: Payer: Self-pay | Admitting: Oncology

## 2012-09-02 VITALS — BP 125/66 | HR 76 | Temp 97.4°F | Resp 20 | Ht 66.0 in | Wt 159.0 lb

## 2012-09-02 DIAGNOSIS — C189 Malignant neoplasm of colon, unspecified: Secondary | ICD-10-CM

## 2012-09-02 DIAGNOSIS — D509 Iron deficiency anemia, unspecified: Secondary | ICD-10-CM

## 2012-09-02 DIAGNOSIS — C185 Malignant neoplasm of splenic flexure: Secondary | ICD-10-CM

## 2012-09-02 LAB — CBC WITH DIFFERENTIAL/PLATELET
BASO%: 0.3 % (ref 0.0–2.0)
EOS%: 0.7 % (ref 0.0–7.0)
LYMPH%: 36.4 % (ref 14.0–49.7)
MCHC: 34.2 g/dL (ref 31.5–36.0)
MONO#: 0.5 10*3/uL (ref 0.1–0.9)
Platelets: 252 10*3/uL (ref 145–400)
RBC: 4.5 10*6/uL (ref 3.70–5.45)
WBC: 7.2 10*3/uL (ref 3.9–10.3)
lymph#: 2.6 10*3/uL (ref 0.9–3.3)
nRBC: 0 % (ref 0–0)

## 2012-09-02 LAB — COMPREHENSIVE METABOLIC PANEL (CC13)
AST: 18 U/L (ref 5–34)
Albumin: 4 g/dL (ref 3.5–5.0)
Alkaline Phosphatase: 52 U/L (ref 40–150)
BUN: 14.4 mg/dL (ref 7.0–26.0)
Potassium: 4.6 mEq/L (ref 3.5–5.1)
Total Bilirubin: 0.48 mg/dL (ref 0.20–1.20)

## 2012-09-02 MED ORDER — HEPARIN SOD (PORK) LOCK FLUSH 100 UNIT/ML IV SOLN
500.0000 [IU] | Freq: Once | INTRAVENOUS | Status: AC
  Administered 2012-09-02: 500 [IU] via INTRAVENOUS
  Filled 2012-09-02: qty 5

## 2012-09-02 MED ORDER — SODIUM CHLORIDE 0.9 % IJ SOLN
10.0000 mL | INTRAMUSCULAR | Status: DC | PRN
  Administered 2012-09-02: 10 mL via INTRAVENOUS
  Filled 2012-09-02: qty 10

## 2012-09-02 NOTE — Progress Notes (Signed)
This office note has been dictated.  #[id number]

## 2012-09-02 NOTE — Progress Notes (Signed)
CC:   Dede Query, MD  PROBLEM LIST: 1. 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  The     patient underwent a colonoscopy by Dr. Dorena Cookey on 12/13/2009,     with biopsy showing adenocarcinoma.  Preoperative CEA was 73.7.     The patient underwent left colectomy with distal pancreatectomy and     splenectomy on 12/14/2009 by Dr. Darnell Level.  The tumor focally     involved the pancreas.  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  There     was focal lymphovascular invasion, but no perineural invasion.  All     margins were negative.  Four out of 15 lymph nodes were positive.     Tumor stage was pT4b pN2a, i.e., stage IIIC.  The patient received     6 months of adjuvant FOLFOX in combination with Neulasta from     02/02/2010 through 07/22/2010.  The patient is disease free. 2. Pedunculated subserosal fibroid simulating a right adnexal mass,     present on CT scan from 12/15/2009. 3. 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 4. Internal hemorrhoids. 5. Left-sided Port-A-Cath placed in September 2011.  MEDICATIONS:  Reviewed and recorded. Current Outpatient Prescriptions  Medication Sig Dispense Refill  . lidocaine-prilocaine (EMLA) cream Apply to port 1-2 hours before procedure  30 g  2   No current facility-administered medications for this visit.   Facility-Administered Medications Ordered in Other Visits  Medication Dose Route Frequency Provider Last Rate Last Dose  . sodium chloride 0.9 % injection 10 mL  10 mL Intravenous PRN Samul Dada, MD   10 mL at 09/02/12 1250    SMOKING HISTORY:  The patient is a former smoker.   HISTORY:  I am seeing Diane Erickson today for followup of her stage IIIC adenocarcinoma involving the splenic flexure, status post adjuvant chemotherapy completed in March 2012.  The patient remains  disease free. She is really without any specific complaints today.  She continues to have some very mild intermittent numbness of her feet related to oxaliplatin.  She retains her Port-A-Cath, which is being flushed every couple of months or sooner.  Port-A-Cath will be flushed with heparin today.  The patient lives in [place] with her daughter, 62, and son, 26.  The patient takes the bus, also rides a bicycle.  She does not have a car. She is on Medicaid.  She is unemployed.  The patient had a colonoscopy on 05/04/2011 by Dr. Belva Agee in [place].  She had a mammogram on 04/12/2012.  Most recent chest x- ray and CT scans of abdomen and pelvis were carried out on 05/20/2012 and were felt to be without evidence of disease.  These were carefully reviewed because there was some confusion about the interpretation which suggested the possibility of recurrent disease.  PHYSICAL EXAMINATION:  General:  The patient looks well.  Vital Signs: She is 52 years old.  Weight is 159 pounds, height 5 feet 6 inches, body surface area 1.83 meters squared.  Blood pressure 125/66.  Other vital signs are normal.  HEENT:  There is no scleral icterus.  Mouth and pharynx are benign.  Dentition is poor.  Lymph:  There is no peripheral adenopathy palpable.  Heart and lungs:  Normal.  Breasts:  Not examined. Lines:  The patient has a left-sided Port-A-Cath  which will be flushed with heparin today.  Abdomen:  Benign, with no organomegaly or masses palpable.  Extremities:  No peripheral edema or clubbing.  Neurologic: Exam was normal.  LABORATORY DATA:  Today white count 7.2, ANC 4.0, hemoglobin 13.9, hematocrit 40.6, platelets 252,000.  Chemistries were normal.  Albumin 4.0.  LDH is pending, as is the CEA.  Chemistries from 05/20/2012 were entirely normal, including LDH and albumin.  CEA was 3.4 on 05/20/2012.  IMAGING STUDIES: 1. Digital bilateral screening mammogram on 04/12/2012 showed no      mammographic evidence of malignancy. 2. CT scan of abdomen and pelvis with IV contrast carried out on     05/20/2012 initially was interpreted as showing a masslike     prominence in the right adnexa.  There was evidence of a partial     left hemicolectomy, without evidence of recurrence at the     anastomosis.  Appendix was not visualized.  Liver, gallbladder,     pancreas, and adrenal glands were normal.  There was no evidence of     hydronephrosis.  There was an accessory/collateral vessel again noted     at the superior left renal pole extending inferiorly to join with     retroperitoneal vessels and likely the renal vein.  There was     evidence of splenectomy and a distal pancreatectomy.  Additional     review by the radiologist in comparison with prior CT scan from     12/15/2009 and an MRI from 01/06/2010 suggested that the right     adnexal mass is most likely a pedunculated subserosal fibroid or a     broad ligament fibroid.  This was not felt to represent a right     ovarian metastasis, and no further specific followup was needed for     this finding.  The appearance of the left upper renal pole with an     apparent draining venous structure raised the possibility of an     arteriovenous malformation.  A slow-growing renal cell carcinoma     was felt to be less likely given the stability of this finding over     previous exams dating back to August 2011.  3. Chest x-ray, 2 view, from 05/20/2012 was negative.  PROCEDURES:  Colonoscopy was carried out on 05/04/2011 by Dr. Belva Agee from [facility].  We do have that report and, aside from internal hemorrhoids, the exam was negative.  IMPRESSION AND PLAN:  This patient is now approaching 3 years from the time of diagnosis, which will be in August 2014.  The patient remains at risk for recurrence of this high-stage colon cancer, IIIC.  Thus far there is no evidence for recurrent disease.  She is asymptomatic,  feels well.  As stated above, her most recent imaging studies were carried out in January and were felt to be negative.  Her labs including CEA are negative.  We are waiting for CEA and LDH today.  The patient will continue to have her Port-A-Cath flushed with heparin every 2 months.  We will plan to see her again in 4 months, which should be late August, at which time we will check CBC, chemistries, and CEA. I suspect that in January 2015 we will carry out another CT scan of the abdomen, after which the patient will be out past 3 years and probably does not need repeat CT scans.  I would be inclined, however, to continue with yearly chest x-rays at least  for a couple more years.  The importance of ongoing followup has been emphasized.  I also told the patient that she will need to have most likely another colonoscopy around December 2015.  Imaging studies from January were carefully reviewed with a radiologist.    ______________________________ Samul Dada, M.D. DSM/MEDQ  D:  09/02/2012  T:  09/02/2012  Job:  [id number]

## 2012-09-02 NOTE — Telephone Encounter (Signed)
gv pt appt schedule for June and August.

## 2012-10-28 ENCOUNTER — Ambulatory Visit (HOSPITAL_BASED_OUTPATIENT_CLINIC_OR_DEPARTMENT_OTHER): Payer: Medicaid Other

## 2012-10-28 VITALS — BP 130/93 | HR 71 | Temp 97.6°F

## 2012-10-28 DIAGNOSIS — C189 Malignant neoplasm of colon, unspecified: Secondary | ICD-10-CM

## 2012-10-28 DIAGNOSIS — Z452 Encounter for adjustment and management of vascular access device: Secondary | ICD-10-CM

## 2012-10-28 MED ORDER — SODIUM CHLORIDE 0.9 % IJ SOLN
10.0000 mL | INTRAMUSCULAR | Status: DC | PRN
  Administered 2012-10-28: 10 mL via INTRAVENOUS
  Filled 2012-10-28: qty 10

## 2012-10-28 MED ORDER — HEPARIN SOD (PORK) LOCK FLUSH 100 UNIT/ML IV SOLN
500.0000 [IU] | Freq: Once | INTRAVENOUS | Status: AC
  Administered 2012-10-28: 500 [IU] via INTRAVENOUS
  Filled 2012-10-28: qty 5

## 2012-10-28 NOTE — Patient Instructions (Addendum)
Call MD for problems 

## 2012-12-24 ENCOUNTER — Telehealth: Payer: Self-pay | Admitting: Hematology and Oncology

## 2012-12-30 ENCOUNTER — Other Ambulatory Visit: Payer: Medicaid Other | Admitting: Lab

## 2012-12-30 ENCOUNTER — Other Ambulatory Visit (HOSPITAL_BASED_OUTPATIENT_CLINIC_OR_DEPARTMENT_OTHER): Payer: Medicaid Other | Admitting: Lab

## 2012-12-30 ENCOUNTER — Ambulatory Visit (HOSPITAL_BASED_OUTPATIENT_CLINIC_OR_DEPARTMENT_OTHER): Payer: Medicaid Other

## 2012-12-30 ENCOUNTER — Ambulatory Visit (HOSPITAL_BASED_OUTPATIENT_CLINIC_OR_DEPARTMENT_OTHER): Payer: Medicaid Other | Admitting: Hematology and Oncology

## 2012-12-30 ENCOUNTER — Ambulatory Visit: Payer: Self-pay

## 2012-12-30 VITALS — BP 129/82 | HR 75 | Temp 97.5°F | Resp 20 | Ht 66.0 in | Wt 151.9 lb

## 2012-12-30 DIAGNOSIS — C189 Malignant neoplasm of colon, unspecified: Secondary | ICD-10-CM

## 2012-12-30 DIAGNOSIS — Z85038 Personal history of other malignant neoplasm of large intestine: Secondary | ICD-10-CM

## 2012-12-30 LAB — CBC WITH DIFFERENTIAL/PLATELET
Basophils Absolute: 0.1 10*3/uL (ref 0.0–0.1)
Eosinophils Absolute: 0.1 10*3/uL (ref 0.0–0.5)
HCT: 42.2 % (ref 34.8–46.6)
HGB: 14.1 g/dL (ref 11.6–15.9)
LYMPH%: 36.6 % (ref 14.0–49.7)
MCV: 93.9 fL (ref 79.5–101.0)
MONO#: 0.7 10*3/uL (ref 0.1–0.9)
MONO%: 9 % (ref 0.0–14.0)
NEUT#: 3.8 10*3/uL (ref 1.5–6.5)
NEUT%: 52.7 % (ref 38.4–76.8)
Platelets: 254 10*3/uL (ref 145–400)
WBC: 7.2 10*3/uL (ref 3.9–10.3)

## 2012-12-30 LAB — COMPREHENSIVE METABOLIC PANEL (CC13)
ALT: 9 U/L (ref 0–55)
AST: 15 U/L (ref 5–34)
Alkaline Phosphatase: 50 U/L (ref 40–150)
Creatinine: 0.7 mg/dL (ref 0.6–1.1)
Sodium: 140 mEq/L (ref 136–145)
Total Bilirubin: 0.65 mg/dL (ref 0.20–1.20)
Total Protein: 7.8 g/dL (ref 6.4–8.3)

## 2012-12-30 LAB — CEA: CEA: 2.9 ng/mL (ref 0.0–5.0)

## 2012-12-30 MED ORDER — SODIUM CHLORIDE 0.9 % IJ SOLN
10.0000 mL | INTRAMUSCULAR | Status: DC | PRN
  Administered 2012-12-30: 10 mL via INTRAVENOUS
  Filled 2012-12-30: qty 10

## 2012-12-30 MED ORDER — HEPARIN SOD (PORK) LOCK FLUSH 100 UNIT/ML IV SOLN
500.0000 [IU] | Freq: Once | INTRAVENOUS | Status: AC
  Administered 2012-12-30: 500 [IU] via INTRAVENOUS
  Filled 2012-12-30: qty 5

## 2012-12-30 NOTE — Progress Notes (Signed)
ID: Erlene Senters OB: 1961-01-26  MR#: [id number]  CSN#:[id number]  [facility]  [phone] [phone]   OFFICE PROGRESS NOTE  PCP: Dierdre Searles, NA, MD  DIAGNOSIS: History of adenocarcinoma of colon, Stage III, (pT4b pN2a M0; 4 out of 15 lymph nodes were positive). The patient is disease free.   PAST THERAPY: S/p resection and 6 months adjuvant FOLFOX chemotherapy. (02/02/2010 through 07/22/2010).  CURRENT THERAPY: None  HISTORY OF PRESENT ILLNESS: The patient is a 52 y.o. African American female with history of moderately differentiated adenocarcinoma involving the splenic flexure, initially  presenting with iron-deficiency anemia. She underwent a colonoscopy by Dr. Dorena Cookey on 12/13/2009 with biopsy showing adenocarcinoma. Preoperative CEA was 73.7. 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 Darnell Level. The tumor focally  involved the pancreas. 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 There was focal lymphovascular invasion, but no perineural invasion. All margins were negative. 4 out of 15 lymph nodes were positive. The patient received 6 months of adjuvant FOLFOX in combination with Neulasta from 02/02/2010 through 07/22/2010.   T scan from 12/15/2009 showed pedunculated subserosal fibroid simulating a right adnexal mass and mass involving the left kidney, felt to be most likely a vascular  Malformation.  INTERVAL HISTORY: Alyona Romack presented today for follow up visit. Her appetite is on and off. Weight is stable. Sometimes she has dyspnea on exertion. Seldom she has epigastric abdominal pain. Patient reported pain in joints, especially legs. The patient denied fever, chills, night sweats. She denied headaches, double vision, blurry vision, nasal congestion, nasal discharge, hearing problems, odynophagia or dysphagia. No chest pain, palpitations,  cough, nausea, vomiting, diarrhea, constipation, hematochezia.  The patient denied dysuria, nocturia, polyuria, hematuria, myalgia, numbness, tingling, psychiatric problems.  Review of Systems  Constitutional: Negative for fever, chills, weight loss, malaise/fatigue and diaphoresis.  HENT: Negative for hearing loss, ear pain, nosebleeds, congestion, sore throat, neck pain and tinnitus.   Eyes: Negative for blurred vision, double vision, photophobia and pain.  Respiratory: Positive for shortness of breath. Negative for cough, hemoptysis, sputum production and wheezing.   Cardiovascular: Negative for chest pain, palpitations, orthopnea, claudication and leg swelling.  Gastrointestinal: Positive for abdominal pain. Negative for heartburn, nausea, vomiting, diarrhea, constipation, blood in stool and melena.  Genitourinary: Negative for dysuria, urgency, frequency and hematuria.  Musculoskeletal: Positive for joint pain. Negative for myalgias and back pain.  Skin: Negative for itching and rash.  Neurological: Negative for dizziness, tingling, sensory change, seizures, loss of consciousness, weakness and headaches.  Endo/Heme/Allergies: Does not bruise/bleed easily.  Psychiatric/Behavioral: Negative.    PAST MEDICAL HISTORY: Past Medical History  Diagnosis Date  . Colon cancer     colon ca dx 8/11    PAST SURGICAL HISTORY: Past Surgical History  Procedure Laterality Date  . Colon surgery      left colectomy, distal pancreatectomy, splenectomy  . Tubal ligation      1997    FAMILY HISTORY Family History  Problem Relation Age of Onset  . Cancer Mother     lung cancer  . Colon cancer Neg Hx     HEALTH MAINTENANCE: History  Substance Use Topics  . Smoking status: Former Games developer  . Smokeless tobacco: Never Used  . Alcohol Use: Yes     Comment: occasional alcohol, wine   No Known Allergies  Current Outpatient Prescriptions  Medication Sig Dispense Refill  . lidocaine-prilocaine (EMLA) cream Apply to port 1-2 hours before procedure  30 g  2     No current facility-administered medications for this visit.   Facility-Administered Medications Ordered in Other Visits  Medication Dose Route Frequency Provider Last Rate Last Dose  . sodium chloride 0.9 % injection 10 mL  10 mL Intravenous PRN Myra Rude, MD   10 mL at 12/30/12 1511    OBJECTIVE: Filed Vitals:   12/30/12 1355  BP: 129/82  Pulse: 75  Temp: 97.5 F (36.4 C)  Resp: 20     Body mass index is 24.53 kg/(m^2).    ECOG FS:0  PHYSICAL EXAMINATION:  HEENT: Sclerae anicteric.  Conjunctivae were pink. Pupils round and reactive bilaterally. Oral mucosa is moist without ulceration or thrush. No occipital, submandibular, cervical, supraclavicular or axillar adenopathy. Lungs: clear to auscultation without wheezes. No rales or rhonchi. Heart: regular rate and rhythm. No murmur, gallop or rubs. Abdomen: soft, non tender. No guarding or rebound tenderness. Bowel sounds are present. No palpable hepatosplenomegaly. MSK: no focal spinal tenderness. Extremities: No clubbing or cyanosis.No calf tenderness to palpitation, no peripheral edema. The patient had grossly intact strength in upper and lower extremities. Skin exam was without ecchymosis, petechiae. Neuro: non-focal, alert and oriented to time, person and place, appropriate affect  LAB RESULTS:  CMP     Component Value Date/Time   NA 140 12/30/2012 1331   NA 139 11/22/2011 1114   NA 137 04/19/2011 1545   K 4.3 12/30/2012 1331   K 3.7 11/22/2011 1114   K 4.7 04/19/2011 1545   CL 105 09/02/2012 1029   CL 105 11/22/2011 1114   CL 103 04/19/2011 1545   CO2 23 12/30/2012 1331   CO2 26 11/22/2011 1114   CO2 30 04/19/2011 1545   GLUCOSE 84 12/30/2012 1331   GLUCOSE 66* 09/02/2012 1029   GLUCOSE 94 11/22/2011 1114   GLUCOSE 92 04/19/2011 1545   BUN 15.5 12/30/2012 1331   BUN 11 11/22/2011 1114   BUN 16 04/19/2011 1545   CREATININE 0.7 12/30/2012 1331   CREATININE 0.53 11/22/2011 1114   CREATININE 0.6 04/19/2011 1545    CALCIUM 9.4 12/30/2012 1331   CALCIUM 9.2 11/22/2011 1114   CALCIUM 8.6 04/19/2011 1545   PROT 7.8 12/30/2012 1331   PROT 7.0 11/22/2011 1114   PROT 7.3 04/19/2011 1545   ALBUMIN 4.0 12/30/2012 1331   ALBUMIN 4.2 11/22/2011 1114   AST 15 12/30/2012 1331   AST 17 11/22/2011 1114   AST 24 04/19/2011 1545   ALT 9 12/30/2012 1331   ALT 9 11/22/2011 1114   ALT 19 04/19/2011 1545   ALKPHOS 50 12/30/2012 1331   ALKPHOS 36* 11/22/2011 1114   ALKPHOS 68 04/19/2011 1545   BILITOT 0.65 12/30/2012 1331   BILITOT 0.4 11/22/2011 1114   BILITOT 0.40 04/19/2011 1545   GFRNONAA >60 01/12/2010 1512   GFRAA  Value: >60        The eGFR has been calculated using the MDRD equation. This calculation has not been validated in all clinical situations. eGFR's persistently <60 mL/min signify possible Chronic Kidney Disease. 01/12/2010 1512    Lab Results  Component Value Date   WBC 7.2 12/30/2012   NEUTROABS 3.8 12/30/2012   HGB 14.1 12/30/2012   HCT 42.2 12/30/2012   MCV 93.9 12/30/2012   PLT 254 12/30/2012      Chemistry      Component Value Date/Time   NA 140 12/30/2012 1331   NA 139 11/22/2011 1114   NA 137 04/19/2011 1545   K 4.3 12/30/2012 1331  K 3.7 11/22/2011 1114   K 4.7 04/19/2011 1545   CL 105 09/02/2012 1029   CL 105 11/22/2011 1114   CL 103 04/19/2011 1545   CO2 23 12/30/2012 1331   CO2 26 11/22/2011 1114   CO2 30 04/19/2011 1545   BUN 15.5 12/30/2012 1331   BUN 11 11/22/2011 1114   BUN 16 04/19/2011 1545   CREATININE 0.7 12/30/2012 1331   CREATININE 0.53 11/22/2011 1114   CREATININE 0.6 04/19/2011 1545      Component Value Date/Time   CALCIUM 9.4 12/30/2012 1331   CALCIUM 9.2 11/22/2011 1114   CALCIUM 8.6 04/19/2011 1545   ALKPHOS 50 12/30/2012 1331   ALKPHOS 36* 11/22/2011 1114   ALKPHOS 68 04/19/2011 1545   AST 15 12/30/2012 1331   AST 17 11/22/2011 1114   AST 24 04/19/2011 1545   ALT 9 12/30/2012 1331   ALT 9 11/22/2011 1114   ALT 19 04/19/2011 1545   BILITOT 0.65 12/30/2012 1331   BILITOT 0.4  11/22/2011 1114   BILITOT 0.40 04/19/2011 1545      ASSESSMENT AND PLAN: 1.  History of adenocarcinoma of colon, Stage III, (pT4b pN2a M0; 4 out of 15 lymph nodes were positive).. S/p resection and 6 months adjuvant FOLFOX chemotherapy. (02/02/2010 through 07/22/2010). CEA 2.9 today.  There is no evidence of recurrent disease. The patient under impression that she still has colon cancer. I tried to explain her that she is disease free..  2. Port-a cath. The patient under impression that she still has colon cancer and does not want to remove port-a-cath. We will continue  flush her Port-A-Cath with with heparin  every 2 months. 3. Annual CT scan is due in January 2015. 4. Mammogram is due in December 2014. 5. Follow up in 4 months.  The length of time of the face-to-face encounter was 15  minutes. More than 50% of time was spent counseling and coordination of care.   Myra Rude, MD   12/30/2012 4:59 PM

## 2012-12-30 NOTE — Patient Instructions (Addendum)
Implanted Port Instructions  An implanted port is a central line that has a round shape and is placed under the skin. It is used for long-term IV (intravenous) access for:  · Medicine.  · Fluids.  · Liquid nutrition, such as TPN (total parenteral nutrition).  · Blood samples.  Ports can be placed:  · In the chest area just below the collarbone (this is the most common place.)  · In the arms.  · In the belly (abdomen) area.  · In the legs.  PARTS OF THE PORT  A port has 2 main parts:  · The reservoir. The reservoir is round, disc-shaped, and will be a small, raised area under your skin.  · The reservoir is the part where a needle is inserted (accessed) to either give medicines or to draw blood.  · The catheter. The catheter is a long, slender tube that extends from the reservoir. The catheter is placed into a large vein.  · Medicine that is inserted into the reservoir goes into the catheter and then into the vein.  INSERTION OF THE PORT  · The port is surgically placed in either an operating room or in a procedural area (interventional radiology).  · Medicine may be given to help you relax during the procedure.  · The skin where the port will be inserted is numbed (local anesthetic).  · 1 or 2 small cuts (incisions) will be made in the skin to insert the port.  · The port can be used after it has been inserted.  INCISION SITE CARE  · The incision site may have small adhesive strips on it. This helps keep the incision site closed. Sometimes, no adhesive strips are placed. Instead of adhesive strips, a special kind of surgical glue is used to keep the incision closed.  · If adhesive strips were placed on the incision sites, do not take them off. They will fall off on their own.  · The incision site may be sore for 1 to 2 days. Pain medicine can help.  · Do not get the incision site wet. Bathe or shower as directed by your caregiver.  · The incision site should heal in 5 to 7 days. A small scar may form after the  incision has healed.  ACCESSING THE PORT  Special steps must be taken to access the port:  · Before the port is accessed, a numbing cream can be placed on the skin. This helps numb the skin over the port site.  · A sterile technique is used to access the port.  · The port is accessed with a needle. Only "non-coring" port needles should be used to access the port. Once the port is accessed, a blood return should be checked. This helps ensure the port is in the vein and is not clogged (clotted).  · If your caregiver believes your port should remain accessed, a clear (transparent) bandage will be placed over the needle site. The bandage and needle will need to be changed every week or as directed by your caregiver.  · Keep the bandage covering the needle clean and dry. Do not get it wet. Follow your caregiver's instructions on how to take a shower or bath when the port is accessed.  · If your port does not need to stay accessed, no bandage is needed over the port.  FLUSHING THE PORT  Flushing the port keeps it from getting clogged. How often the port is flushed depends on:  · If a   constant infusion is running. If a constant infusion is running, the port may not need to be flushed.  · If intermittent medicines are given.  · If the port is not being used.  For intermittent medicines:  · The port will need to be flushed:  · After medicines have been given.  · After blood has been drawn.  · As part of routine maintenance.  · A port is normally flushed with:  · Normal saline.  · Heparin.  · Follow your caregiver's advice on how often, how much, and the type of flush to use on your port.  IMPORTANT PORT INFORMATION  · Tell your caregiver if you are allergic to heparin.  · After your port is placed, you will get a manufacturer's information card. The card has information about your port. Keep this card with you at all times.  · There are many types of ports available. Know what kind of port you have.  · In case of an  emergency, it may be helpful to wear a medical alert bracelet. This can help alert health care workers that you have a port.  · The port can stay in for as long as your caregiver believes it is necessary.  · When it is time for the port to come out, surgery will be done to remove it. The surgery will be similar to how the port was put in.  · If you are in the hospital or clinic:  · Your port will be taken care of and flushed by a nurse.  · If you are at home:  · A home health care nurse may give medicines and take care of the port.  · You or a family member can get special training and directions for giving medicine and taking care of the port at home.  SEEK IMMEDIATE MEDICAL CARE IF:   · Your port does not flush or you are unable to get a blood return.  · New drainage or pus is coming from the incision.  · A bad smell is coming from the incision site.  · You develop swelling or increased redness at the incision site.  · You develop increased swelling or pain at the port site.  · You develop swelling or pain in the surrounding skin near the port.  · You have an oral temperature above 102° F (38.9° C), not controlled by medicine.  MAKE SURE YOU:   · Understand these instructions.  · Will watch your condition.  · Will get help right away if you are not doing well or get worse.  Document Released: 04/24/2005 Document Revised: 07/17/2011 Document Reviewed: 07/16/2008  [employer]® Patient Information ©2014 [employer].

## 2013-01-01 ENCOUNTER — Telehealth: Payer: Self-pay | Admitting: Hematology and Oncology

## 2013-01-01 NOTE — Telephone Encounter (Signed)
Not able to reach pt by phone or lm re appts. Schedule for October and December mailed.

## 2013-01-09 ENCOUNTER — Ambulatory Visit (INDEPENDENT_AMBULATORY_CARE_PROVIDER_SITE_OTHER): Payer: Medicaid Other | Admitting: Internal Medicine

## 2013-01-09 ENCOUNTER — Encounter: Payer: Self-pay | Admitting: Internal Medicine

## 2013-01-09 VITALS — BP 130/91 | HR 82 | Temp 97.5°F | Ht 66.0 in | Wt 152.9 lb

## 2013-01-09 DIAGNOSIS — C189 Malignant neoplasm of colon, unspecified: Secondary | ICD-10-CM

## 2013-01-09 DIAGNOSIS — Z0000 Encounter for general adult medical examination without abnormal findings: Secondary | ICD-10-CM

## 2013-01-09 NOTE — Assessment & Plan Note (Addendum)
Patient has been followed by oncologist Dr. Dalene Carrow and Dr. Roanna Raider. She is disease free now.   - continue to follow up with her oncologist.  - she can follow up with me as needed.

## 2013-01-09 NOTE — Progress Notes (Signed)
Patient ID: Diane Erickson, female   DOB: [birth year], 52 y.o.   MRN: [id number]  Subjective:   Patient ID: Diane Erickson female   DOB: August 15, 1960 52 y.o.   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 She is status post resection and 6 months of adjuvant FOLFOX with Neulasta support from 01/02/2010 through 07/22/2010. She follows with Oncologist Dr. Dalene Erickson and Dr. Roanna Erickson.    She states that she is doing well. No c/o. She was last evaluated by Dr. Roanna Erickson on 12/30/12 and was told that she is disease free now. Will defer the followup surveillance colonoscopy to her oncologist. She is due for mammogram after 04/15/13. Ordered entered.    Past Medical History  Diagnosis Date  . Colon cancer     colon ca dx 8/11   Current Outpatient Prescriptions  Medication Sig Dispense Refill  . lidocaine-prilocaine (EMLA) cream Apply to port 1-2 hours before procedure  30 g  2   No current facility-administered medications for this visit.   Family History  Problem Relation Age of Onset  . Cancer Mother     lung cancer  . Colon cancer Neg Hx    History   Social History  . Marital Status: Single    Spouse Name: N/A    Number of Children: N/A  . Years of Education: N/A   Occupational History  .      works at [employer] as a Production assistant, radio in Health and safety inspector   Social History Main Topics  . Smoking status: Former Games developer  . Smokeless tobacco: Never Used  . Alcohol Use: Yes     Comment: occasional alcohol, wine  . Drug Use: No  . Sexual Activity: Yes    Birth Control/ Protection: Surgical   Other Topics Concern  . None   Social History Narrative   Patient lives alone and with her 2 children( 20 years and 22 years old). Sexually active iwthon epartner,  does not use condom.   Review of Systems: Review of Systems:  Constitutional:  Denies fever, chills, diaphoresis, appetite change and fatigue.   HEENT:  Denies congestion, sore throat, rhinorrhea, sneezing, mouth sores, trouble swallowing, neck pain   Respiratory:  Denies SOB, DOE, cough, and wheezing.   Cardiovascular:  Denies palpitations and leg swelling.   Gastrointestinal:  Denies nausea, vomiting, abdominal pain, diarrhea, constipation, blood in stool and abdominal distention.   Genitourinary:  Denies dysuria, urgency, frequency, hematuria, flank pain and difficulty urinating.   Musculoskeletal:  Denies myalgias, back pain, joint swelling, arthralgias and gait problem.   Skin:  Denies pallor, rash and wound.   Neurological:  Denies dizziness, seizures, syncope, weakness, light-headedness, numbness and headaches.    .   Objective:  Physical Exam: Filed Vitals:   01/09/13 1515  BP: 130/91  Pulse: 82  Temp: 97.5 F (36.4 C)  TempSrc: Oral  Height: 5\' 6"  (1.676 m)  Weight: 152 lb 14.4 oz (69.355 kg)  SpO2: 100%   General: NAD Neck: supple, full ROM, no thyromegaly, no JVD.  Lungs: CTA B/L Heart: RRR, No M/G/R Abdomen: soft, non-tender, normal bowel sounds, no distention, no guarding, no rebound tenderness. Msk: no joint warmth, and no redness over joints.   Pulses: 2+ DP/PT pulses bilaterally Extremities: No cyanosis, clubbing, edema Neurologic:  alert & oriented X3.  Assessment & Plan:

## 2013-01-09 NOTE — Patient Instructions (Addendum)
1. Follow up with me in 6 months as needed.

## 2013-01-15 NOTE — Progress Notes (Signed)
Case discussed with Dr. Li soon after the resident saw the patient. We reviewed the resident's history and exam and pertinent patient test results. I agree with the assessment, diagnosis, and plan of care documented in the resident's note. 

## 2013-01-31 ENCOUNTER — Telehealth: Payer: Self-pay | Admitting: Oncology

## 2013-01-31 NOTE — Telephone Encounter (Signed)
pt called to make all appt moring appts.

## 2013-02-18 ENCOUNTER — Ambulatory Visit (HOSPITAL_BASED_OUTPATIENT_CLINIC_OR_DEPARTMENT_OTHER): Payer: Medicaid Other

## 2013-02-18 DIAGNOSIS — C189 Malignant neoplasm of colon, unspecified: Secondary | ICD-10-CM

## 2013-02-18 DIAGNOSIS — Z452 Encounter for adjustment and management of vascular access device: Secondary | ICD-10-CM

## 2013-02-18 DIAGNOSIS — C185 Malignant neoplasm of splenic flexure: Secondary | ICD-10-CM

## 2013-02-18 MED ORDER — SODIUM CHLORIDE 0.9 % IJ SOLN
10.0000 mL | INTRAMUSCULAR | Status: DC | PRN
  Administered 2013-02-18: 10 mL via INTRAVENOUS
  Filled 2013-02-18: qty 10

## 2013-02-18 MED ORDER — HEPARIN SOD (PORK) LOCK FLUSH 100 UNIT/ML IV SOLN
500.0000 [IU] | Freq: Once | INTRAVENOUS | Status: AC
  Administered 2013-02-18: 500 [IU] via INTRAVENOUS
  Filled 2013-02-18: qty 5

## 2013-02-18 NOTE — Patient Instructions (Signed)

## 2013-03-20 ENCOUNTER — Telehealth: Payer: Self-pay | Admitting: Internal Medicine

## 2013-03-20 NOTE — Telephone Encounter (Signed)
sw pt adv rs 12/23 appts due to Dr Rosie Fate out that day Mail cal to pt shh

## 2013-04-15 ENCOUNTER — Ambulatory Visit (HOSPITAL_BASED_OUTPATIENT_CLINIC_OR_DEPARTMENT_OTHER): Payer: Medicaid Other

## 2013-04-15 VITALS — BP 142/78 | HR 74 | Temp 97.0°F

## 2013-04-15 DIAGNOSIS — C185 Malignant neoplasm of splenic flexure: Secondary | ICD-10-CM

## 2013-04-15 DIAGNOSIS — Z452 Encounter for adjustment and management of vascular access device: Secondary | ICD-10-CM

## 2013-04-15 DIAGNOSIS — C189 Malignant neoplasm of colon, unspecified: Secondary | ICD-10-CM

## 2013-04-15 MED ORDER — SODIUM CHLORIDE 0.9 % IJ SOLN
10.0000 mL | INTRAMUSCULAR | Status: DC | PRN
  Administered 2013-04-15: 10 mL via INTRAVENOUS
  Filled 2013-04-15: qty 10

## 2013-04-15 MED ORDER — HEPARIN SOD (PORK) LOCK FLUSH 100 UNIT/ML IV SOLN
500.0000 [IU] | Freq: Once | INTRAVENOUS | Status: AC
  Administered 2013-04-15: 500 [IU] via INTRAVENOUS
  Filled 2013-04-15: qty 5

## 2013-04-15 NOTE — Patient Instructions (Signed)
Implanted Port Instructions  An implanted port is a central line that has a round shape and is placed under the skin. It is used for long-term IV (intravenous) access for:  · Medicine.  · Fluids.  · Liquid nutrition, such as TPN (total parenteral nutrition).  · Blood samples.  Ports can be placed:  · In the chest area just below the collarbone (this is the most common place.)  · In the arms.  · In the belly (abdomen) area.  · In the legs.  PARTS OF THE PORT  A port has 2 main parts:  · The reservoir. The reservoir is round, disc-shaped, and will be a small, raised area under your skin.  · The reservoir is the part where a needle is inserted (accessed) to either give medicines or to draw blood.  · The catheter. The catheter is a long, slender tube that extends from the reservoir. The catheter is placed into a large vein.  · Medicine that is inserted into the reservoir goes into the catheter and then into the vein.  INSERTION OF THE PORT  · The port is surgically placed in either an operating room or in a procedural area (interventional radiology).  · Medicine may be given to help you relax during the procedure.  · The skin where the port will be inserted is numbed (local anesthetic).  · 1 or 2 small cuts (incisions) will be made in the skin to insert the port.  · The port can be used after it has been inserted.  INCISION SITE CARE  · The incision site may have small adhesive strips on it. This helps keep the incision site closed. Sometimes, no adhesive strips are placed. Instead of adhesive strips, a special kind of surgical glue is used to keep the incision closed.  · If adhesive strips were placed on the incision sites, do not take them off. They will fall off on their own.  · The incision site may be sore for 1 to 2 days. Pain medicine can help.  · Do not get the incision site wet. Bathe or shower as directed by your caregiver.  · The incision site should heal in 5 to 7 days. A small scar may form after the  incision has healed.  ACCESSING THE PORT  Special steps must be taken to access the port:  · Before the port is accessed, a numbing cream can be placed on the skin. This helps numb the skin over the port site.  · A sterile technique is used to access the port.  · The port is accessed with a needle. Only "non-coring" port needles should be used to access the port. Once the port is accessed, a blood return should be checked. This helps ensure the port is in the vein and is not clogged (clotted).  · If your caregiver believes your port should remain accessed, a clear (transparent) bandage will be placed over the needle site. The bandage and needle will need to be changed every week or as directed by your caregiver.  · Keep the bandage covering the needle clean and dry. Do not get it wet. Follow your caregiver's instructions on how to take a shower or bath when the port is accessed.  · If your port does not need to stay accessed, no bandage is needed over the port.  FLUSHING THE PORT  Flushing the port keeps it from getting clogged. How often the port is flushed depends on:  · If a   constant infusion is running. If a constant infusion is running, the port may not need to be flushed.  · If intermittent medicines are given.  · If the port is not being used.  For intermittent medicines:  · The port will need to be flushed:  · After medicines have been given.  · After blood has been drawn.  · As part of routine maintenance.  · A port is normally flushed with:  · Normal saline.  · Heparin.  · Follow your caregiver's advice on how often, how much, and the type of flush to use on your port.  IMPORTANT PORT INFORMATION  · Tell your caregiver if you are allergic to heparin.  · After your port is placed, you will get a manufacturer's information card. The card has information about your port. Keep this card with you at all times.  · There are many types of ports available. Know what kind of port you have.  · In case of an  emergency, it may be helpful to wear a medical alert bracelet. This can help alert health care workers that you have a port.  · The port can stay in for as long as your caregiver believes it is necessary.  · When it is time for the port to come out, surgery will be done to remove it. The surgery will be similar to how the port was put in.  · If you are in the hospital or clinic:  · Your port will be taken care of and flushed by a nurse.  · If you are at home:  · A home health care nurse may give medicines and take care of the port.  · You or a family member can get special training and directions for giving medicine and taking care of the port at home.  SEEK IMMEDIATE MEDICAL CARE IF:   · Your port does not flush or you are unable to get a blood return.  · New drainage or pus is coming from the incision.  · A bad smell is coming from the incision site.  · You develop swelling or increased redness at the incision site.  · You develop increased swelling or pain at the port site.  · You develop swelling or pain in the surrounding skin near the port.  · You have an oral temperature above 102° F (38.9° C), not controlled by medicine.  MAKE SURE YOU:   · Understand these instructions.  · Will watch your condition.  · Will get help right away if you are not doing well or get worse.  Document Released: 04/24/2005 Document Revised: 07/17/2011 Document Reviewed: 07/16/2008  Exit[employer]® Patient Information ©2014 [employer].

## 2013-04-29 ENCOUNTER — Ambulatory Visit: Payer: Medicaid Other

## 2013-04-29 ENCOUNTER — Other Ambulatory Visit: Payer: Medicaid Other | Admitting: Lab

## 2013-05-05 ENCOUNTER — Ambulatory Visit (HOSPITAL_BASED_OUTPATIENT_CLINIC_OR_DEPARTMENT_OTHER): Payer: Medicaid Other

## 2013-05-05 ENCOUNTER — Other Ambulatory Visit: Payer: Self-pay | Admitting: Internal Medicine

## 2013-05-05 ENCOUNTER — Ambulatory Visit (HOSPITAL_BASED_OUTPATIENT_CLINIC_OR_DEPARTMENT_OTHER): Payer: Medicaid Other | Admitting: Internal Medicine

## 2013-05-05 ENCOUNTER — Encounter: Payer: Self-pay | Admitting: Internal Medicine

## 2013-05-05 VITALS — BP 130/93 | HR 74 | Temp 96.9°F | Resp 18 | Ht 66.0 in | Wt 155.3 lb

## 2013-05-05 DIAGNOSIS — C185 Malignant neoplasm of splenic flexure: Secondary | ICD-10-CM

## 2013-05-05 DIAGNOSIS — C189 Malignant neoplasm of colon, unspecified: Secondary | ICD-10-CM

## 2013-05-05 DIAGNOSIS — Z0000 Encounter for general adult medical examination without abnormal findings: Secondary | ICD-10-CM

## 2013-05-05 LAB — COMPREHENSIVE METABOLIC PANEL (CC13)
ALT: 11 U/L (ref 0–55)
AST: 18 U/L (ref 5–34)
Alkaline Phosphatase: 43 U/L (ref 40–150)
Anion Gap: 10 mEq/L (ref 3–11)
BUN: 11.4 mg/dL (ref 7.0–26.0)
CO2: 27 mEq/L (ref 22–29)
Sodium: 141 mEq/L (ref 136–145)
Total Bilirubin: 0.71 mg/dL (ref 0.20–1.20)
Total Protein: 7.9 g/dL (ref 6.4–8.3)

## 2013-05-05 LAB — CBC WITH DIFFERENTIAL/PLATELET
BASO%: 0.9 % (ref 0.0–2.0)
EOS%: 1.5 % (ref 0.0–7.0)
Eosinophils Absolute: 0.1 10*3/uL (ref 0.0–0.5)
LYMPH%: 32.5 % (ref 14.0–49.7)
MCHC: 33.3 g/dL (ref 31.5–36.0)
MONO#: 0.7 10*3/uL (ref 0.1–0.9)
NEUT#: 3 10*3/uL (ref 1.5–6.5)
Platelets: 225 10*3/uL (ref 145–400)
RBC: 4.61 10*6/uL (ref 3.70–5.45)
RDW: 13.3 % (ref 11.2–14.5)
WBC: 5.7 10*3/uL (ref 3.9–10.3)
lymph#: 1.9 10*3/uL (ref 0.9–3.3)

## 2013-05-05 NOTE — Progress Notes (Signed)
[facility] OFFICE PROGRESS NOTE  LI, NA, MD [address] [place] [address]  DIAGNOSIS: Stage III, (pT4b pN2a M0; 4 out of 15 lymph nodes were positive). ADENOCARCINOMA, COLON - Plan: CBC with Differential, Comprehensive metabolic panel (Cmet) - [facility], CEA, CT Abdomen Pelvis W Contrast  Chief Complaint  Patient presents with  . ADENOCARCINOMA, COLON    PAST THERAPY: S/p resection and 6 months adjuvant FOLFOX chemotherapy. (02/02/2010 through 07/22/2010).   CURRENT THERAPY: None   INTERVAL HISTORY: Diane Erickson 52 y.o. 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 She underwent a colonoscopy by Dr. Dorena Cookey on 12/13/2009 with biopsy showing adenocarcinoma. Preoperative CEA was 73.7. 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 Darnell Level. The tumor focally involved the pancreas. 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 There was focal lymphovascular invasion, but no perineural invasion. All margins were negative. 4 out of 15 lymph nodes were positive. The patient received 6 months of adjuvant FOLFOX in combination with Neulasta from 02/02/2010 through 07/22/2010. 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  She was last seen by Dr. Roanna Raider on 12/30/2012. She is here for follow-up alone.  Her appetite is stable.  She reports diffuse arthralgias and myalgias that have been ongoing.  This is mainly in her lower extremities.  She denies hematochezia or melana or changes in stool quality. She will schedule her mammogram.   MEDICAL HISTORY: Past Medical History  Diagnosis Date  . Colon cancer     colon ca dx 8/11   INTERIM HISTORY: has ADENOCARCINOMA, COLON and hyperlipidemia on her  problem list.    ALLERGIES:  has No Known Allergies.  MEDICATIONS: currently has no medications in their medication list.  SURGICAL HISTORY:  Past Surgical History  Procedure Laterality Date  . Colon surgery      left colectomy, distal pancreatectomy, splenectomy  . Tubal ligation      1997   REVIEW OF SYSTEMS:   Constitutional: Denies fevers, chills or abnormal weight loss Eyes: Denies blurriness of vision Ears, nose, mouth, throat, and face: Denies mucositis or sore throat Respiratory: Denies cough, dyspnea or wheezes Cardiovascular: Denies palpitation, chest discomfort or lower extremity swelling Gastrointestinal:  Denies nausea, heartburn or change in bowel habits Skin: Denies abnormal skin rashes Lymphatics: Denies new lymphadenopathy or easy bruising Neurological:Denies numbness, tingling or new weaknesses Behavioral/Psych: Mood is stable, no new changes  All other systems were reviewed with the patient and are negative.  PHYSICAL EXAMINATION: ECOG PERFORMANCE STATUS: 0 - Asymptomatic  Blood pressure 130/93, pulse 74, temperature 96.9 F (36.1 C), temperature source Oral, resp. rate 18, height 5\' 6"  (1.676 m), weight 155 lb 4.8 oz (70.444 kg).  GENERAL:alert, no distress and comfortable; mildly overweight.  SKIN: skin color, texture, turgor are normal, no rashes or significant lesions EYES: normal, Conjunctiva are pink and non-injected, sclera clear OROPHARYNX:no exudate, no erythema and lips, buccal mucosa, and tongue normal  NECK: supple, thyroid normal size, non-tender, without nodularity LYMPH:  no palpable lymphadenopathy in the cervical, axillary or supraclavicular LUNGS: clear to auscultation and percussion with normal breathing effort HEART: regular rate & rhythm and no murmurs and no lower extremity edema ABDOMEN:abdomen soft, non-tender and normal bowel sounds; mildline scar well healed.  Musculoskeletal:no cyanosis of digits and no clubbing  NEURO:  alert &  oriented x 3 with fluent speech, no focal motor/sensory deficits  LABORATORY DATA: Results for orders placed in visit on 05/05/13 (from the past 48 hour(s))  CBC WITH DIFFERENTIAL     Status: None   Collection Time    05/05/13  8:52 AM      Result Value Range   WBC 5.7  3.9 - 10.3 10e3/uL   NEUT# 3.0  1.5 - 6.5 10e3/uL   HGB 14.5  11.6 - 15.9 g/dL   HCT 11.9  14.7 - 82.9 %   Platelets 225  145 - 400 10e3/uL   MCV 94.6  79.5 - 101.0 fL   MCH 31.5  25.1 - 34.0 pg   MCHC 33.3  31.5 - 36.0 g/dL   RBC 5.62  1.30 - 8.65 10e6/uL   RDW 13.3  11.2 - 14.5 %   lymph# 1.9  0.9 - 3.3 10e3/uL   MONO# 0.7  0.1 - 0.9 10e3/uL   Eosinophils Absolute 0.1  0.0 - 0.5 10e3/uL   Basophils Absolute 0.1  0.0 - 0.1 10e3/uL   NEUT% 52.2  38.4 - 76.8 %   LYMPH% 32.5  14.0 - 49.7 %   MONO% 12.9  0.0 - 14.0 %   EOS% 1.5  0.0 - 7.0 %   BASO% 0.9  0.0 - 2.0 %  COMPREHENSIVE METABOLIC PANEL (CC13)     Status: None   Collection Time    05/05/13  8:52 AM      Result Value Range   Sodium 141  136 - 145 mEq/L   Potassium 4.6  3.5 - 5.1 mEq/L   Chloride 104  98 - 109 mEq/L   CO2 27  22 - 29 mEq/L   Glucose 124  70 - 140 mg/dl   BUN 78.4  7.0 - 69.6 mg/dL   Creatinine 0.8  0.6 - 1.1 mg/dL   Total Bilirubin 2.95  0.20 - 1.20 mg/dL   Alkaline Phosphatase 43  40 - 150 U/L   AST 18  5 - 34 U/L   ALT 11  0 - 55 U/L   Total Protein 7.9  6.4 - 8.3 g/dL   Albumin 4.1  3.5 - 5.0 g/dL   Calcium 9.8  8.4 - 28.4 mg/dL   Anion Gap 10  3 - 11 mEq/L  CEA     Status: None   Collection Time    05/05/13  8:52 AM      Result Value Range   CEA 2.9  0.0 - 5.0 ng/mL    Labs:  Lab Results  Component Value Date   WBC 5.7 05/05/2013   HGB 14.5 05/05/2013   HCT 43.6 05/05/2013   MCV 94.6 05/05/2013   PLT 225 05/05/2013   NEUTROABS 3.0 05/05/2013      Chemistry      Component Value Date/Time   NA 141 05/05/2013 0852   NA 139 11/22/2011 1114   NA 137 04/19/2011 1545   K 4.6 05/05/2013 0852   K 3.7 11/22/2011 1114    K 4.7 04/19/2011 1545   CL 105 09/02/2012 1029   CL 105 11/22/2011 1114   CL 103 04/19/2011 1545   CO2 27 05/05/2013 0852   CO2 26 11/22/2011 1114   CO2 30 04/19/2011 1545   BUN 11.4 05/05/2013 0852   BUN 11 11/22/2011 1114   BUN 16 04/19/2011 1545   CREATININE 0.8 05/05/2013 0852   CREATININE 0.53 11/22/2011 1114   CREATININE 0.6 04/19/2011 1545  Component Value Date/Time   CALCIUM 9.8 05/05/2013 0852   CALCIUM 9.2 11/22/2011 1114   CALCIUM 8.6 04/19/2011 1545   ALKPHOS 43 05/05/2013 0852   ALKPHOS 36* 11/22/2011 1114   ALKPHOS 68 04/19/2011 1545   AST 18 05/05/2013 0852   AST 17 11/22/2011 1114   AST 24 04/19/2011 1545   ALT 11 05/05/2013 0852   ALT 9 11/22/2011 1114   ALT 19 04/19/2011 1545   BILITOT 0.71 05/05/2013 0852   BILITOT 0.4 11/22/2011 1114   BILITOT 0.40 04/19/2011 1545     Basic Metabolic Panel:  Recent Labs Lab 05/05/13 0852  NA 141  K 4.6  CO2 27  GLUCOSE 124  BUN 11.4  CREATININE 0.8  CALCIUM 9.8   GFR Estimated Creatinine Clearance: 78.8 ml/min (by C-G formula based on Cr of 0.8). Liver Function Tests:  Recent Labs Lab 05/05/13 0852  AST 18  ALT 11  ALKPHOS 43  BILITOT 0.71  PROT 7.9  ALBUMIN 4.1   CBC:  Recent Labs Lab 05/05/13 0852  WBC 5.7  NEUTROABS 3.0  HGB 14.5  HCT 43.6  MCV 94.6  PLT 225   Results for CIEARRA, RUFO (MRN [id number]) as of 05/05/2013 19:40  Ref. Range 09/02/2012 10:29 12/30/2012 13:31 05/05/2013 08:52  CEA Latest Range: 0.0-5.0 ng/mL 3.2 2.9 2.9   RADIOGRAPHIC STUDIES: No results found.  ASSESSMENT: [age] y.o. female with a history of ADENOCARCINOMA, COLON - Plan: CBC with Differential, Comprehensive metabolic panel (Cmet) - [facility], CEA, CT Abdomen Pelvis W Contrast   PLAN:  1. History of adenocarcinoma of colon, Stage III, (pT4b pN2a M0; 4 out of 15 lymph nodes were positive). S/p resection and 6 months adjuvant FOLFOX chemotherapy. (02/02/2010 through 07/22/2010). CEA 2.9 today. There is no  evidence of recurrent disease. We will repeat a CT of abdomen and pelvis in January 2015.     2. Port-a cath.  We will continue flush her Port-A-Cath with with heparin every 2 months.  3. Annual CT scan is due in January 2015.  4. Mammogram is due in December 2014.  5. Follow up in 4 months.  All questions were answered. The patient knows to call the clinic with any problems, questions or concerns. We can certainly see the patient much sooner if necessary.  I spent 10 minutes counseling the patient face to face. The total time spent in the appointment was 15 minutes.    Audel Coakley, MD 05/05/2013 5:01 PM

## 2013-05-07 ENCOUNTER — Telehealth: Payer: Self-pay | Admitting: Internal Medicine

## 2013-05-07 NOTE — Telephone Encounter (Signed)
, °

## 2013-05-16 ENCOUNTER — Other Ambulatory Visit: Payer: Self-pay

## 2013-05-16 DIAGNOSIS — Z1231 Encounter for screening mammogram for malignant neoplasm of breast: Secondary | ICD-10-CM

## 2013-05-19 ENCOUNTER — Ambulatory Visit (HOSPITAL_COMMUNITY)
Admission: RE | Admit: 2013-05-19 | Discharge: 2013-05-19 | Disposition: A | Discharge status: Home / Self Care | Payer: Medicaid Other | Source: Ambulatory Visit | Attending: Internal Medicine | Admitting: Internal Medicine

## 2013-05-19 ENCOUNTER — Encounter (HOSPITAL_COMMUNITY): Payer: Self-pay

## 2013-05-19 DIAGNOSIS — N269 Renal sclerosis, unspecified: Secondary | ICD-10-CM | POA: Insufficient documentation

## 2013-05-19 DIAGNOSIS — C189 Malignant neoplasm of colon, unspecified: Secondary | ICD-10-CM | POA: Insufficient documentation

## 2013-05-19 DIAGNOSIS — N289 Disorder of kidney and ureter, unspecified: Secondary | ICD-10-CM | POA: Insufficient documentation

## 2013-05-19 DIAGNOSIS — D252 Subserosal leiomyoma of uterus: Secondary | ICD-10-CM | POA: Insufficient documentation

## 2013-05-19 DIAGNOSIS — J984 Other disorders of lung: Secondary | ICD-10-CM | POA: Insufficient documentation

## 2013-05-19 DIAGNOSIS — Z9221 Personal history of antineoplastic chemotherapy: Secondary | ICD-10-CM | POA: Insufficient documentation

## 2013-05-19 DIAGNOSIS — N281 Cyst of kidney, acquired: Secondary | ICD-10-CM | POA: Insufficient documentation

## 2013-05-19 DIAGNOSIS — K7689 Other specified diseases of liver: Secondary | ICD-10-CM | POA: Insufficient documentation

## 2013-05-19 DIAGNOSIS — Z9089 Acquired absence of other organs: Secondary | ICD-10-CM | POA: Insufficient documentation

## 2013-05-19 DIAGNOSIS — Z9049 Acquired absence of other specified parts of digestive tract: Secondary | ICD-10-CM | POA: Insufficient documentation

## 2013-05-19 MED ORDER — IOHEXOL 300 MG/ML  SOLN
100.0000 mL | Freq: Once | INTRAMUSCULAR | Status: AC | PRN
  Administered 2013-05-19: 100 mL via INTRAVENOUS

## 2013-05-20 ENCOUNTER — Telehealth: Payer: Self-pay | Admitting: Internal Medicine

## 2013-05-20 ENCOUNTER — Other Ambulatory Visit: Payer: Self-pay | Admitting: Internal Medicine

## 2013-05-20 DIAGNOSIS — C189 Malignant neoplasm of colon, unspecified: Secondary | ICD-10-CM

## 2013-05-20 NOTE — Telephone Encounter (Signed)
Made patient aware of CT results demonstrating new hyperenhancing right lobe lesion on the liver.  She will require follow-up MRI of the abdomen.  She is aware.  In addition, informed her that the left renal was stable favoring benign etiology.

## 2013-05-29 ENCOUNTER — Ambulatory Visit (HOSPITAL_COMMUNITY)
Admission: RE | Admit: 2013-05-29 | Discharge: 2013-05-29 | Disposition: A | Discharge status: Home / Self Care | Payer: Medicaid Other | Source: Ambulatory Visit | Attending: Internal Medicine | Admitting: Internal Medicine

## 2013-05-29 ENCOUNTER — Other Ambulatory Visit: Payer: Self-pay | Admitting: Internal Medicine

## 2013-05-29 DIAGNOSIS — C189 Malignant neoplasm of colon, unspecified: Secondary | ICD-10-CM

## 2013-05-29 DIAGNOSIS — K7689 Other specified diseases of liver: Secondary | ICD-10-CM | POA: Insufficient documentation

## 2013-05-29 DIAGNOSIS — N289 Disorder of kidney and ureter, unspecified: Secondary | ICD-10-CM | POA: Insufficient documentation

## 2013-05-29 DIAGNOSIS — N281 Cyst of kidney, acquired: Secondary | ICD-10-CM | POA: Insufficient documentation

## 2013-05-29 DIAGNOSIS — K769 Liver disease, unspecified: Secondary | ICD-10-CM

## 2013-05-29 DIAGNOSIS — Z85038 Personal history of other malignant neoplasm of large intestine: Secondary | ICD-10-CM | POA: Insufficient documentation

## 2013-05-29 MED ORDER — GADOBENATE DIMEGLUMINE 529 MG/ML IV SOLN
15.0000 mL | Freq: Once | INTRAVENOUS | Status: AC | PRN
  Administered 2013-05-29: 15 mL via INTRAVENOUS

## 2013-05-30 ENCOUNTER — Telehealth: Payer: Self-pay

## 2013-05-30 ENCOUNTER — Telehealth: Payer: Self-pay | Admitting: Internal Medicine

## 2013-05-30 NOTE — Telephone Encounter (Signed)
S/w pt that MRI did show liver lesions that Dr Juliann Mule wants to have biopsied. I described numbing of the skin and using an ultrasound to guide a needle to the site to be biopsied. LVM w/Anne the scheduler to contact pt again. Pt was confused about 1/29 appt at [facility] vs radiology for biopsy.

## 2013-05-30 NOTE — Telephone Encounter (Signed)
, °

## 2013-06-02 ENCOUNTER — Telehealth: Payer: Self-pay | Admitting: Internal Medicine

## 2013-06-02 ENCOUNTER — Other Ambulatory Visit: Payer: Self-pay | Admitting: Radiology

## 2013-06-02 NOTE — Telephone Encounter (Signed)
no vm set up....mailed pt appt sched and aletter

## 2013-06-04 ENCOUNTER — Encounter (HOSPITAL_COMMUNITY): Payer: Self-pay | Admitting: Pharmacy Technician

## 2013-06-05 ENCOUNTER — Ambulatory Visit: Payer: Medicaid Other

## 2013-06-05 ENCOUNTER — Ambulatory Visit (HOSPITAL_COMMUNITY)
Admission: RE | Admit: 2013-06-05 | Discharge: 2013-06-05 | Disposition: A | Discharge status: Home / Self Care | Payer: Medicaid Other | Source: Ambulatory Visit | Attending: Internal Medicine | Admitting: Internal Medicine

## 2013-06-05 ENCOUNTER — Encounter (HOSPITAL_COMMUNITY): Payer: Self-pay

## 2013-06-05 ENCOUNTER — Ambulatory Visit (HOSPITAL_COMMUNITY): Admission: RE | Admit: 2013-06-05 | Payer: Medicaid Other | Source: Ambulatory Visit

## 2013-06-05 DIAGNOSIS — C189 Malignant neoplasm of colon, unspecified: Secondary | ICD-10-CM

## 2013-06-05 DIAGNOSIS — Z85038 Personal history of other malignant neoplasm of large intestine: Secondary | ICD-10-CM | POA: Insufficient documentation

## 2013-06-05 DIAGNOSIS — K7689 Other specified diseases of liver: Secondary | ICD-10-CM | POA: Insufficient documentation

## 2013-06-05 DIAGNOSIS — K769 Liver disease, unspecified: Secondary | ICD-10-CM

## 2013-06-05 DIAGNOSIS — Z90411 Acquired partial absence of pancreas: Secondary | ICD-10-CM | POA: Insufficient documentation

## 2013-06-05 DIAGNOSIS — Z9049 Acquired absence of other specified parts of digestive tract: Secondary | ICD-10-CM | POA: Insufficient documentation

## 2013-06-05 DIAGNOSIS — N289 Disorder of kidney and ureter, unspecified: Secondary | ICD-10-CM | POA: Insufficient documentation

## 2013-06-05 DIAGNOSIS — Z9851 Tubal ligation status: Secondary | ICD-10-CM | POA: Insufficient documentation

## 2013-06-05 LAB — CBC
HCT: 41.6 % (ref 36.0–46.0)
Hemoglobin: 14.2 g/dL (ref 12.0–15.0)
MCH: 31.6 pg (ref 26.0–34.0)
MCHC: 34.1 g/dL (ref 30.0–36.0)
MCV: 92.4 fL (ref 78.0–100.0)
Platelets: 240 10*3/uL (ref 150–400)
RBC: 4.5 MIL/uL (ref 3.87–5.11)
RDW: 14.6 % (ref 11.5–15.5)
WBC: 7.4 10*3/uL (ref 4.0–10.5)

## 2013-06-05 LAB — PROTIME-INR
INR: 0.95 (ref 0.00–1.49)
Prothrombin Time: 12.5 seconds (ref 11.6–15.2)

## 2013-06-05 LAB — APTT: aPTT: 27 seconds (ref 24–37)

## 2013-06-05 MED ORDER — SODIUM CHLORIDE 0.9 % IV SOLN
INTRAVENOUS | Status: DC
  Administered 2013-06-05: 11:00:00 via INTRAVENOUS

## 2013-06-05 MED ORDER — MIDAZOLAM HCL 2 MG/2ML IJ SOLN
INTRAMUSCULAR | Status: AC
  Filled 2013-06-05: qty 4

## 2013-06-05 MED ORDER — HYDROCODONE-ACETAMINOPHEN 5-325 MG PO TABS
1.0000 | ORAL_TABLET | ORAL | Status: DC | PRN
  Filled 2013-06-05: qty 2

## 2013-06-05 MED ORDER — MIDAZOLAM HCL 2 MG/2ML IJ SOLN
INTRAMUSCULAR | Status: AC | PRN
  Administered 2013-06-05: 2 mg via INTRAVENOUS
  Administered 2013-06-05 (×2): 1 mg via INTRAVENOUS

## 2013-06-05 MED ORDER — FENTANYL CITRATE 0.05 MG/ML IJ SOLN
INTRAMUSCULAR | Status: AC | PRN
  Administered 2013-06-05 (×2): 50 ug via INTRAVENOUS
  Administered 2013-06-05: 100 ug via INTRAVENOUS

## 2013-06-05 MED ORDER — FENTANYL CITRATE 0.05 MG/ML IJ SOLN
INTRAMUSCULAR | Status: AC
  Filled 2013-06-05: qty 4

## 2013-06-05 NOTE — Procedures (Addendum)
US liver lesion FNA x2 to cytopath No complication   After Quickstain, 18g coax core x4 to surg path No complication No blood loss. See complete dictation in [facility]

## 2013-06-05 NOTE — H&P (Signed)
Diane Erickson is an 53 y.o. female.   Chief Complaint: "I'm having a liver biopsy" HPI: Patient with history of stage III colon carcinoma and recent imaging studies revealing left upper pole renal mass and multiple liver lesions presents today for US guided liver lesion biopsy.  Past Medical History  Diagnosis Date  . Colon cancer     colon ca dx 8/11    Past Surgical History  Procedure Laterality Date  . Colon surgery      left colectomy, distal pancreatectomy, splenectomy  . Tubal ligation      1997    Family History  Problem Relation Age of Onset  . Cancer Mother     lung cancer  . Colon cancer Neg Hx    Social History:  reports that she has quit smoking. She has never used smokeless tobacco. She reports that she drinks alcohol. She reports that she does not use illicit drugs.  Allergies: No Known Allergies  No current outpatient prescriptions on file. Current facility-administered medications:0.9 %  sodium chloride infusion, , Intravenous, Continuous, Ascencion Dike, PA-C, Last Rate: 20 mL/hr at 06/05/13 1052   Results for orders placed during the hospital encounter of 06/05/13 (from the past 48 hour(s))  APTT     Status: None   Collection Time    06/05/13 10:55 AM      Result Value Range   aPTT 27  24 - 37 seconds  CBC     Status: None   Collection Time    06/05/13 10:55 AM      Result Value Range   WBC 7.4  4.0 - 10.5 K/uL   RBC 4.50  3.87 - 5.11 MIL/uL   Hemoglobin 14.2  12.0 - 15.0 g/dL   HCT 41.6  36.0 - 46.0 %   MCV 92.4  78.0 - 100.0 fL   MCH 31.6  26.0 - 34.0 pg   MCHC 34.1  30.0 - 36.0 g/dL   RDW 14.6  11.5 - 15.5 %   Platelets 240  150 - 400 K/uL  PROTIME-INR     Status: None   Collection Time    06/05/13 10:55 AM      Result Value Range   Prothrombin Time 12.5  11.6 - 15.2 seconds   INR 0.95  0.00 - 1.49   No results found.  Review of Systems  Constitutional: Negative for fever and chills.  Respiratory: Negative for cough and shortness of  breath.   Cardiovascular: Negative for chest pain.  Gastrointestinal: Negative for nausea and vomiting.       Occ mid abd discomfort  Musculoskeletal: Negative for back pain.  Neurological: Negative for headaches.  Endo/Heme/Allergies: Does not bruise/bleed easily.    Blood pressure 143/93, pulse 78, temperature 97.7 F (36.5 C), temperature source Oral, resp. rate 18, height 5\' 6"  (1.676 m), weight 150 lb (68.04 kg), last menstrual period 04/21/2012, SpO2 100.00%. Physical Exam  Constitutional: She is oriented to person, place, and time. She appears well-developed and well-nourished.  Cardiovascular: Normal rate and regular rhythm.   Respiratory: Effort normal and breath sounds normal.  GI: Soft. Bowel sounds are normal.  Mild mid abd tenderness to palpation  Musculoskeletal: Normal range of motion. She exhibits no edema.  Neurological: She is alert and oriented to person, place, and time.     Assessment/Plan Patient with history of stage III colon carcinoma and recent imaging studies revealing left upper pole renal mass and multiple liver lesions presents today for US guided liver  lesion biopsy. Details/risks of procedure d/w pt with her understanding and consent.    Zaahir Pickney,D KEVIN 06/05/2013, 11:33 AM

## 2013-06-05 NOTE — Discharge Instructions (Signed)
Conscious Sedation, Adult, Care After °Refer to this sheet in the next few weeks. These instructions provide you with information on caring for yourself after your procedure. Your health care provider may also give you more specific instructions. Your treatment has been planned according to current medical practices, but problems sometimes occur. Call your health care provider if you have any problems or questions after your procedure. °WHAT TO EXPECT AFTER THE PROCEDURE  °After your procedure: °· You may feel sleepy, clumsy, and have poor balance for several hours. °· Vomiting may occur if you eat too soon after the procedure. °HOME CARE INSTRUCTIONS °· Do not participate in any activities where you could become injured for at least 24 hours. Do not: °· Drive. °· Swim. °· Ride a bicycle. °· Operate heavy machinery. °· Cook. °· Use power tools. °· Climb ladders. °· Work from a high place. °· Do not make important decisions or sign legal documents until you are improved. °· If you vomit, drink water, juice, or soup when you can drink without vomiting. Make sure you have little or no nausea before eating solid foods. °· Only take over-the-counter or prescription medicines for pain, discomfort, or fever as directed by your health care provider. °· Make sure you and your family fully understand everything about the medicines given to you, including what side effects may occur. °· You should not drink alcohol, take sleeping pills, or take medicines that cause drowsiness for at least 24 hours. °· If you smoke, do not smoke without supervision. °· If you are feeling better, you may resume normal activities 24 hours after you were sedated. °· Keep all appointments with your health care provider. °SEEK MEDICAL CARE IF: °· Your skin is pale or bluish in color. °· You continue to feel nauseous or vomit. °· Your pain is getting worse and is not helped by medicine. °· You have bleeding or swelling. °· You are still sleepy or  feeling clumsy after 24 hours. °SEEK IMMEDIATE MEDICAL CARE IF: °· You develop a rash. °· You have difficulty breathing. °· You develop any type of allergic problem. °· You have a fever. °MAKE SURE YOU: °· Understand these instructions. °· Will watch your condition. °· Will get help right away if you are not doing well or get worse. °Document Released: 02/12/2013 Document Reviewed: 11/29/2012 °[employer]® Patient Information ©2014 [employer]. ° °Liver Biopsy °Care After °Refer to this sheet in the next few weeks. These discharge instructions provide you with general information on caring for yourself after you leave the hospital. Your caregiver may also give you specific instructions. Your treatment has been planned according to the most current medical practices available, but unavoidable complications sometimes occur. If you have any problems or questions after discharge, please call your caregiver. °HOME CARE INSTRUCTIONS  °· You should rest for 1 to 2 days or as instructed. °· If you go home the same day as your procedure (outpatient), have a responsible adult take you home and stay with you overnight. °· Do not lift more than 5 pounds or play contact sports for 2 weeks. °· Do not drive for 24 hours after this test. °· Do not take medicine containing aspirin or drink alcohol for 1 week after this test. °· Change bandages (dressings) as directed. °· Only take over-the-counter or prescription medicines for pain, discomfort, or fever as directed by your caregiver. °OBTAINING YOUR TEST RESULTS °Not all test results are available during your visit. If your test results are not back   during the visit, make an appointment with your caregiver to find out the results. Do not assume everything is normal if you have not heard from your caregiver or the medical facility. It is important for you to follow up on all of your test results. °SEEK MEDICAL CARE IF:  °· You have increased bleeding (more than a small spot) from the  biopsy site. °· You have redness, swelling, or increasing pain in the biopsy site. °· You have an oral temperature above 102° F (38.9° C). °SEEK IMMEDIATE MEDICAL CARE IF:  °· You develop swelling or pain in the belly (abdomen). °· You develop a rash. °· You have difficulty breathing, feel short of breath, or feel faint. °· You develop any reaction or side effects to medicines given. °MAKE SURE YOU:  °· Understand these instructions. °· Will watch your condition. °· Will get help right away if you are not doing well or get worse. °Document Released: 11/11/2004 Document Revised: 07/17/2011 Document Reviewed: 12/05/2007 °[employer]® Patient Information ©2014 [employer]. ° °

## 2013-06-06 ENCOUNTER — Ambulatory Visit
Admission: RE | Admit: 2013-06-06 | Discharge: 2013-06-06 | Disposition: A | Discharge status: Home / Self Care | Payer: Medicaid Other | Source: Ambulatory Visit

## 2013-06-06 DIAGNOSIS — Z1231 Encounter for screening mammogram for malignant neoplasm of breast: Secondary | ICD-10-CM

## 2013-06-13 ENCOUNTER — Ambulatory Visit (HOSPITAL_BASED_OUTPATIENT_CLINIC_OR_DEPARTMENT_OTHER): Payer: Medicaid Other | Admitting: Internal Medicine

## 2013-06-13 VITALS — BP 168/91 | HR 80 | Temp 97.0°F | Resp 18 | Ht 66.0 in | Wt 155.1 lb

## 2013-06-13 DIAGNOSIS — C189 Malignant neoplasm of colon, unspecified: Secondary | ICD-10-CM

## 2013-06-13 DIAGNOSIS — C779 Secondary and unspecified malignant neoplasm of lymph node, unspecified: Secondary | ICD-10-CM

## 2013-06-13 DIAGNOSIS — C185 Malignant neoplasm of splenic flexure: Secondary | ICD-10-CM

## 2013-06-13 NOTE — Patient Instructions (Signed)
Colorectal Cancer Colorectal cancer is an abnormal growth of tissue (tumor) in the colon or rectum that is cancerous (malignant). Unlike noncancerous (benign) tumors, malignant tumors can spread to other parts of your body. The colon is the large bowel or large intestine. The rectum is the last several inches of the colon.  RISK FACTORS The exact cause of colorectal cancer is unknown. However, the following factors may increase your chances of getting colorectal cancer:   Age older than 24 years.   Abnormal growths (polyps) on the inner wall of the colon or rectum.   Diabetes.   African American race.   Family history of hereditary nonpolyposis colorectal cancer. This condition is caused by changes in the genes that are responsible for repairing mismatched DNA.   Personal history of cancer. A person who has already had colorectal cancer may develop it a second time. Also, women with a history of ovarian, uterine, or breast cancer are at a somewhat higher risk of developing colorectal cancer.  Certain hereditary conditions.  Eating a diet that is high in fat (especially animal fat) and low in fiber, fruits, and vegetables.  Sedentary lifestyle.  Inflammatory bowel disease, including ulcerative colitis and Crohn disease.   Smoking.   Excessive alcohol use.  SYMPTOMS Early colorectal cancer often does not cause symptoms. As the cancer grows, symptoms may include:   Changes in bowel habits.  Diarrhea.   Constipation.   Feeling like the bowel does not empty completely after a bowel movement.   Blood in the stool.   Stools that are narrower than usual.   Abdominal discomfort, pain, bloating, fullness, or cramps.  Frequent gas pain.   Unexplained weight loss.   Constant tiredness.   Nausea and vomiting.  DIAGNOSIS  Your health care provider will ask about your medical history. He or she may also perform a number of procedures, such as:   A physical  exam.  A digital rectal exam.  A fecal occult blood test.  A barium enema.  Blood tests.   X-rays.   Imaging tests, such as CT scans or MRIs.   Taking a tissue sample (biopsy) from your colon or rectum to look for cancer cells.   A sigmoidoscopy to view the inside of the last part of your colon.   A colonoscopy to view the inside of your entire colon.   An endorectal ultrasound to see how deep a rectal tumor has grown and whether the cancer has spread to lymph nodes or other nearby tissues.  Your cancer will be staged to determine its severity and extent. Staging is a careful attempt to find out the size of the tumor, whether the cancer has spread, and if so, to what parts of the body. You may need to have more tests to determine the stage of your cancer. The test results will help determine what treatment plan is best for you.   Stage 0 The cancer is found only in the innermost lining of the colon or rectum.   Stage I The cancer has grown into the inner wall of the colon or rectum. The cancer has not yet reached the outer wall of the colon.   Stage II The cancer extends more deeply into or through the wall of the colon or rectum. It may have invaded nearby tissue, but cancer cells have not spread to the lymph nodes.   Stage III The cancer has spread to nearby lymph nodes but not to other parts of the body.  Stage IV The cancer has spread to other parts of the body, such as the liver or lungs.  Your health care provider may tell you the detailed stage of your cancer, which includes both a number and a letter.  TREATMENT  Depending on the type and stage, colorectal cancer may be treated with surgery, radiation therapy, chemotherapy, targeted therapy, or radiofrequency ablation. Some people have a combination of these therapies. Surgery may be done to remove the polyps from your colon. In early stages, your health care provider may be able to do this during a  colonoscopy. In later stages, surgery may be done to remove part of your colon.  HOME CARE INSTRUCTIONS   Only take over-the-counter or prescription medicines for pain, discomfort, or fever as directed by your health care provider.   Maintain a healthy diet.   Consider joining a support group. This may help you learn to cope with the stress of having colorectal cancer.   Seek advice to help you manage treatment of side effects.   Keep all follow-up appointments as directed by your health care provider.   Inform your cancer specialist if you are admitted to the hospital.  SEEK MEDICAL CARE IF:  Your diarrhea or constipation does not go away.   Your bowel habits change.  You have increased abdominal pain.   You notice new fatigue or weakness.  You lose weight. Document Released: 04/24/2005 Document Revised: 12/25/2012 Document Reviewed: 10/17/2012 [facility] Patient Information 2014 [employer], [place]. CEA, Carcinoembryonic Antigen This is a test to determine whether cancer is present in the body and to monitor cancer treatment. CEA is a protein that is found in embryonic tissues. By the time a baby is born, detectable levels in the blood disappear. CEA levels can indicate some non-cancer-related conditions, such as some forms of inflammation, cirrhosis, and peptic ulcer. Also, smokers tend to have higher CEA levels than non-smokers. CEA is a protein that is normally not able to be detected in the blood of a healthy person. When the protein appears in the blood of an adult, it can indicate cancer, but it will not indicate which kind of cancer is present. CEA is often used to monitor patients with cancers of the gastrointestinal (GI) tract. It can also indicate benign conditions. CEA is most useful to monitor treatment of cancer patients. It is used with patients who have had surgery to measure response to therapy and to monitor whether the disease is recurring. A blood test for CEA is  often used as a tumor marker. Physicians can use CEA results to determine the stage and extent of disease, and the outlook in patients with cancer, especially gastrointestinal (GI) and, in particular, colorectal cancer. CEA is also used as a marker for other forms of cancer. It has been found helpful in monitoring patients with cancer of the rectum, lung, breast, liver, pancreas, stomach, and ovary. Not all cancers produce CEA, therefore the CEA test is not used for screening the general population. PREPARATION FOR TEST A blood sample is obtained by inserting a needle into a vein in the arm.  NORMAL FINDINGS You should test negative. Ranges for normal findings may vary among different laboratories and hospitals. You should always check with your doctor after having lab work or other tests done to discuss the meaning of your test results and whether your values are considered within normal limits. MEANING OF TEST  Your caregiver will go over the test results with you and discuss the importance and meaning  of your results, as well as treatment options and the need for additional tests if necessary. OBTAINING THE TEST RESULTS It is your responsibility to obtain your test results. Ask the lab or department performing the test when and how you will get your results. Document Released: 05/18/2004 Document Revised: 07/17/2011 Document Reviewed: 04/01/2008 [facility] Patient Information 2014 [place], [place]. Mammography Mammography is an X-ray of the breasts to look for changes that are not normal. The X-ray image is called a mammogram. This procedure can screen for breast cancer, can detect cancer early, and can diagnose cancer.  LET YOUR CAREGIVER KNOW ABOUT:  Breast implants.  Previous breast disease, biopsy, or surgery.  If you are breastfeeding.  Medicines taken, including vitamins, herbs, eyedrops, over-the-counter medicines, and creams.  Use of steroids (by mouth or creams).  Possibility of  pregnancy, if this applies. RISKS AND COMPLICATIONS  Exposure to radiation, but at very low levels.  The results may be misinterpreted.  The results may not be accurate.  Mammography may lead to further tests.  Mammography may not catch certain cancers. BEFORE THE PROCEDURE  Schedule your test about 7 days after your menstrual period. This is when your breasts are the least tender and have signs of hormone changes.  If you have had a mammography done at a different facility in the past, get the mammogram X-rays or have them sent to your current exam facility in order to compare them.  Wash your breasts and under your arms the day of the test.  Do not wear deodorants, perfumes, or powders anywhere on your body.  Wear clothes that you can change in and out of easily. PROCEDURE Relax as much as possible during the test. Any discomfort during the test will be very brief. The test should take less than 30 minutes. The following will happen:  You will undress from the waist up and put on a gown.  You will stand in front of the X-ray machine.  Each breast will be placed between 2 plastic or glass plates. The plates will compress your breast for a few seconds.  X-rays will be taken from different angles of the breast. AFTER THE PROCEDURE  The mammogram will be examined.  Depending on the quality of the images, you may need to repeat certain parts of the test.  Ask when your test results will be ready. Make sure you get your test results.  You may resume normal activities. Document Released: 04/21/2000 Document Revised: 07/17/2011 Document Reviewed: 02/12/2011 [facility] Patient Information 2014 [place].

## 2013-06-15 NOTE — Progress Notes (Signed)
[facility] OFFICE PROGRESS NOTE  LI, NA, MD [address] [place] [address]  DIAGNOSIS: Stage III, (pT4b pN2a M0; 4 out of 15 lymph nodes were positive). ADENOCARCINOMA, COLON - Plan: CEA, MR Abdomen W Wo Contrast, Ambulatory referral to Genetics  Chief Complaint  Patient presents with  . ADENOCARCINOMA, COLON    PAST THERAPY: S/p resection and 6 months adjuvant FOLFOX chemotherapy. (02/02/2010 through 07/22/2010).   CURRENT THERAPY: None   INTERVAL HISTORY: Diane Erickson 53 y.o. 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 She underwent a colonoscopy by Dr. Dorena Cookey on 12/13/2009 with biopsy showing adenocarcinoma. Preoperative CEA was 73.7. 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 Darnell Level. The tumor focally involved the pancreas. 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 There was focal lymphovascular invasion, but no perineural invasion. All margins were negative. 4 out of 15 lymph nodes were positive. The patient received 6 months of adjuvant FOLFOX in combination with Neulasta from 02/02/2010 through 07/22/2010. 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  She was last seen by me on 12/292014. She is here for follow-up alone.  Her appetite is stable.  She has has an interim CT of abdomen in May 19, 2013 followed by a MRI of abdomen concerning for liver metastases.  She had a ultrasound guided biopsy that showed negative for malignancy.   She denies hematochezia or melana or changes in stool quality. She had her mammogram on 01/30 which was negative for malignancy.   MEDICAL HISTORY: Past Medical History  Diagnosis Date  . Colon cancer     colon ca dx 8/11   INTERIM  HISTORY: has ADENOCARCINOMA, COLON and hyperlipidemia on her problem list.    ALLERGIES:  has No Known Allergies.  MEDICATIONS: currently has no medications in their medication list.  SURGICAL HISTORY:  Past Surgical History  Procedure Laterality Date  . Colon surgery      left colectomy, distal pancreatectomy, splenectomy  . Tubal ligation      1997   REVIEW OF SYSTEMS:   Constitutional: Denies fevers, chills or abnormal weight loss Eyes: Denies blurriness of vision Ears, nose, mouth, throat, and face: Denies mucositis or sore throat Respiratory: Denies cough, dyspnea or wheezes Cardiovascular: Denies palpitation, chest discomfort or lower extremity swelling Gastrointestinal:  Denies nausea, heartburn or change in bowel habits Skin: Denies abnormal skin rashes Lymphatics: Denies new lymphadenopathy or easy bruising Neurological:Denies numbness, tingling or new weaknesses Behavioral/Psych: Mood is stable, no new changes  All other systems were reviewed with the patient and are negative.  PHYSICAL EXAMINATION: ECOG PERFORMANCE STATUS: 0 - Asymptomatic  Blood pressure 168/91, pulse 80, temperature 97 F (36.1 C), temperature source Oral, resp. rate 18, height 5\' 6"  (1.676 m), weight 155 lb 1.6 oz (70.353 kg), last menstrual period 04/21/2012.  GENERAL:alert, no distress and comfortable; mildly overweight.  SKIN: skin color, texture, turgor are normal, no rashes or significant lesions EYES: normal, Conjunctiva are pink and non-injected, sclera clear OROPHARYNX:no exudate, no erythema and lips, buccal mucosa, and tongue normal  NECK: supple, thyroid normal size, non-tender, without nodularity LYMPH:  no palpable lymphadenopathy in the cervical, axillary or supraclavicular LUNGS: clear to auscultation and percussion with normal breathing effort HEART: regular rate & rhythm and no murmurs and no lower extremity edema  ABDOMEN:abdomen soft, non-tender and normal bowel sounds;  mildline scar well healed.  Musculoskeletal:no cyanosis of digits and no clubbing  NEURO: alert & oriented x 3 with fluent speech, no focal motor/sensory deficits  LABORATORY DATA: No results found for this or any previous visit (from the past 48 hour(s)).  Labs:  Lab Results  Component Value Date   WBC 7.4 06/05/2013   HGB 14.2 06/05/2013   HCT 41.6 06/05/2013   MCV 92.4 06/05/2013   PLT 240 06/05/2013   NEUTROABS 3.0 05/05/2013      Chemistry      Component Value Date/Time   NA 141 05/05/2013 0852   NA 139 11/22/2011 1114   NA 137 04/19/2011 1545   K 4.6 05/05/2013 0852   K 3.7 11/22/2011 1114   K 4.7 04/19/2011 1545   CL 105 09/02/2012 1029   CL 105 11/22/2011 1114   CL 103 04/19/2011 1545   CO2 27 05/05/2013 0852   CO2 26 11/22/2011 1114   CO2 30 04/19/2011 1545   BUN 11.4 05/05/2013 0852   BUN 11 11/22/2011 1114   BUN 16 04/19/2011 1545   CREATININE 0.8 05/05/2013 0852   CREATININE 0.53 11/22/2011 1114   CREATININE 0.6 04/19/2011 1545      Component Value Date/Time   CALCIUM 9.8 05/05/2013 0852   CALCIUM 9.2 11/22/2011 1114   CALCIUM 8.6 04/19/2011 1545   ALKPHOS 43 05/05/2013 0852   ALKPHOS 36* 11/22/2011 1114   ALKPHOS 68 04/19/2011 1545   AST 18 05/05/2013 0852   AST 17 11/22/2011 1114   AST 24 04/19/2011 1545   ALT 11 05/05/2013 0852   ALT 9 11/22/2011 1114   ALT 19 04/19/2011 1545   BILITOT 0.71 05/05/2013 0852   BILITOT 0.4 11/22/2011 1114   BILITOT 0.40 04/19/2011 1545     Results for Diane Erickson, Diane Erickson (MRN [id number]) as of 06/15/2013 09:55  Ref. Range 05/20/2012 14:53 09/02/2012 10:29 12/30/2012 13:31 05/05/2013 08:52  CEA Latest Range: 0.0-5.0 ng/mL 3.4 3.2 2.9 2.9   RADIOGRAPHIC STUDIES: 05/19/2013 CT ABDOMEN AND PELVIS WITH CONTRAST  TECHNIQUE: (REVIEWED PERSONALLY BY ME WITH THE PATIENT) Multidetector CT imaging of the abdomen and pelvis was performed using the standard protocol following bolus administration of intravenous contrast. CONTRAST: 161mL OMNIPAQUE  IOHEXOL 300 MG/ML SOLN COMPARISON: 05/20/2012. FINDINGS: Lower Chest: Scarring at the left lung base. Normal heart size without pericardial or pleural effusion.  Abdomen/Pelvis: Hepatic steatosis. A focus of hyper enhancement in the pericholecystic region of the right lobe of the liver measures 1.1 cm on image 20. Not readily apparent on the prior. Splenectomy. Normal pancreas, gallbladder, biliary tract, adrenal glands. Upper pole right renal cyst. Mild atrophy involving the upper pole left kidney. A central upper pole left renal Mass measures 3.4 x 2.4 cm, including on image 13/series 4. This has been present back to 12/15/2009 exam and measures similarly on that study. Circumaortic left renal vein. No retroperitoneal or retrocrural adenopathy. Normal colon and terminal ileum. Normal small bowel without abdominal ascites. No pelvic adenopathy. Normal urinary bladder. Right-sided sub serosal fibroid again identified. Less conspicuous than on the prior exam. Otherwise normal uterus, without adnexal mass or significant free pelvic fluid. No evidence of omental or peritoneal disease. Bones/Musculoskeletal: No acute osseous abnormality. IMPRESSION: 1. Hepatic steatosis with a new hyper attenuating or hyperenhancing focus in the right lobe of the liver. Although this is not typical of hepatic metastasis from colon carcinoma, this cannot be excluded. Consider further characterization with pre and post contrast  abdominal MRI. 2. Otherwise, no evidence of metastatic disease. 3. Upper pole left renal mass. This was suspicious for renal cell carcinoma back on the 01/06/2010 MRI. Given its lack of significant interval enlargement, an indolent and possibly benign lesion is favored. This could be re-evaluated on follow-up MRI.  05/29/2013 MRI ABDOMEN WITHOUT AND WITH CONTRAST  TECHNIQUE: (REVIEWED PERSONALLY BY ME WITH THE PATIENT)Multiplanar multisequence MR imaging of the abdomen was performed both before and after the  administration of intravenous contrast. CONTRAST: 64mL MULTIHANCE GADOBENATE DIMEGLUMINE 529 MG/ML IV SOLN  COMPARISON: 05/19/2013 FINDINGS: No pleural effusion or pericardial effusion noted. There are no suspicious liver abnormalities to suggest metastatic disease. There are at least 4 hyperenhancing liver lesions identified within the right lobe of liver. The largest lesion is adjacent to the gallbladder an measures 1.3 cm. These are both relatively isointense  to liver parenchyma on the precontrast T1 weighted images as well as the T2 weighted images. These lesions are not identified on the diffusion-weighted sequences. The smaller of the lesions becomes isoechoic with the adjacent liver parenchyma on the delayed images. The dominant lesion remains slightly hyperintense to the adjacent liver parenchyma on subsequent delayed images. The gallbladder appears normal. No significant biliary dilatation. The pancreas is unremarkable. Spleen is not visualized and may be surgically absent. The adrenal glands are both normal. There is a cyst within the upper pole of the right kidney measuring 1.1 cm, image number 52/series 13001. Within knee upper pole of the left kidney there is a solid,  enhancing mass which measures 3.6 cm, image 59/series 1300 and 1. The E left renal vein appears patent. There is no evidence for retroperitoneal adenopathy identified. Review of the visualized osseous structures shows no evidence for osseous metastatic disease. IMPRESSION: 1. Solid, enhancing mass within the upper pole the left kidney is worrisome for primary renal neoplasm. There is no evidence for renal vein invasion or retroperitoneal, lymph node metastasis. 2. Multi focal hyper enhancing liver lesion are identified. Findings may reflect benign or malignant liver neoplasms. In a patient who has known malignancy, liver metastasis cannot be excluded. Since  these are hypervascular and may be difficult to identify on  noncontrast  CT, consider ultrasound of the liver. If these are  visible on the liver ultrasound then one of these should be  amendable to ultrasound-guided percutaneous biopsy.  PATHOLOGY:  06/05/2013 Diagnosis Liver, needle/core biopsy - BENIGN LIVER.- NO EVIDENCE OF MALIGNANCY, SEE COMMENT.Diagnosis Note  There is no evidence of malignancy and the biopsy is likely not representative of the lesion in question. These findings correlate with those of the liver fine needle aspiration ([id number]).Vicente Males MD Pathologist, Electronic Signature (Case signed 06/06/2013)  ASSESSMENT: Diane Erickson 53 y.o. female with a history of ADENOCARCINOMA, COLON - Plan: CEA, MR Abdomen W Wo Contrast, Ambulatory referral to Genetics   PLAN:  1. History of adenocarcinoma of colon, Stage III, (pT4b pN2a M0; 4 out of 15 lymph nodes were positive). S/p resection and 6 months adjuvant FOLFOX chemotherapy. (02/02/2010 through 07/22/2010). CEA 2.9 last visit. We repeated a CT of abdomen and pelvis in January 2015 followed by MRI of abdomen as indicated above.  Ultrasound-guided biopsy was obtained and was negative for malignancy.   We discussed repeating biopsy without versus following up with repeat MRI in 2-3 months to assess change in size.  If further increasing, we will require surgical consultation to determine if positive for adenocarcinoma. She is asymptomatic presently.  Her stools are normal caliber.  We  will refer for repeat colonoscopy with Dr. Teena Irani.    --We will also refer for genetic counseling given her age with colon cancer.   2. Port-a cath.  We will continue flush her Port-A-Cath with with heparin every 2 months. 3. Mammogram is due in January 2016.  4. Follow up in 4 months.  All questions were answered. The patient knows to call the clinic with any problems, questions or concerns. We can certainly see the patient much sooner if necessary.  I spent 25 minutes counseling the patient face to face. The total  time spent in the appointment was 40 minutes.    Angila Wombles, MD 06/15/2013 9:53 AM

## 2013-06-18 ENCOUNTER — Ambulatory Visit (HOSPITAL_BASED_OUTPATIENT_CLINIC_OR_DEPARTMENT_OTHER): Payer: Medicaid Other

## 2013-06-18 VITALS — BP 130/85 | HR 72 | Temp 97.4°F

## 2013-06-18 DIAGNOSIS — Z95828 Presence of other vascular implants and grafts: Secondary | ICD-10-CM

## 2013-06-18 DIAGNOSIS — C779 Secondary and unspecified malignant neoplasm of lymph node, unspecified: Secondary | ICD-10-CM

## 2013-06-18 DIAGNOSIS — Z452 Encounter for adjustment and management of vascular access device: Secondary | ICD-10-CM

## 2013-06-18 DIAGNOSIS — C185 Malignant neoplasm of splenic flexure: Secondary | ICD-10-CM

## 2013-06-18 MED ORDER — HEPARIN SOD (PORK) LOCK FLUSH 100 UNIT/ML IV SOLN
500.0000 [IU] | Freq: Once | INTRAVENOUS | Status: AC
  Administered 2013-06-18: 500 [IU] via INTRAVENOUS
  Filled 2013-06-18: qty 5

## 2013-06-18 MED ORDER — SODIUM CHLORIDE 0.9 % IJ SOLN
10.0000 mL | INTRAMUSCULAR | Status: DC | PRN
  Administered 2013-06-18: 10 mL via INTRAVENOUS
  Filled 2013-06-18: qty 10

## 2013-06-18 NOTE — Progress Notes (Signed)
Patient in for Port-A-Cath flush today. Port accessed and flushed without any complaints. Unable to scan Patient's armband due to system downtime. Patient tolerated flush without any difficulty.

## 2013-06-18 NOTE — Patient Instructions (Signed)

## 2013-07-31 ENCOUNTER — Other Ambulatory Visit: Payer: Self-pay | Admitting: Medical Oncology

## 2013-07-31 MED ORDER — LIDOCAINE-PRILOCAINE 2.5-2.5 % EX CREA: 1.0000 "application " | TOPICAL_CREAM | CUTANEOUS | Status: DC | PRN

## 2013-08-12 ENCOUNTER — Ambulatory Visit (HOSPITAL_COMMUNITY): Admission: RE | Admit: 2013-08-12 | Payer: Medicaid Other | Source: Ambulatory Visit

## 2013-08-12 ENCOUNTER — Other Ambulatory Visit: Payer: Self-pay

## 2013-08-12 DIAGNOSIS — C189 Malignant neoplasm of colon, unspecified: Secondary | ICD-10-CM

## 2013-08-14 ENCOUNTER — Ambulatory Visit (HOSPITAL_BASED_OUTPATIENT_CLINIC_OR_DEPARTMENT_OTHER): Payer: Medicaid Other | Admitting: Internal Medicine

## 2013-08-14 ENCOUNTER — Encounter: Payer: Self-pay | Admitting: Internal Medicine

## 2013-08-14 ENCOUNTER — Other Ambulatory Visit (HOSPITAL_BASED_OUTPATIENT_CLINIC_OR_DEPARTMENT_OTHER): Payer: Medicaid Other

## 2013-08-14 ENCOUNTER — Ambulatory Visit (HOSPITAL_BASED_OUTPATIENT_CLINIC_OR_DEPARTMENT_OTHER): Payer: Medicaid Other

## 2013-08-14 ENCOUNTER — Other Ambulatory Visit: Payer: Self-pay | Admitting: Medical Oncology

## 2013-08-14 VITALS — BP 125/68 | HR 75 | Temp 97.5°F | Resp 18 | Ht 66.0 in | Wt 153.2 lb

## 2013-08-14 DIAGNOSIS — C185 Malignant neoplasm of splenic flexure: Secondary | ICD-10-CM

## 2013-08-14 DIAGNOSIS — C779 Secondary and unspecified malignant neoplasm of lymph node, unspecified: Secondary | ICD-10-CM

## 2013-08-14 DIAGNOSIS — C189 Malignant neoplasm of colon, unspecified: Secondary | ICD-10-CM

## 2013-08-14 DIAGNOSIS — Z95828 Presence of other vascular implants and grafts: Secondary | ICD-10-CM

## 2013-08-14 LAB — CEA: CEA: 2.7 ng/mL (ref 0.0–5.0)

## 2013-08-14 MED ORDER — SODIUM CHLORIDE 0.9 % IJ SOLN
10.0000 mL | INTRAMUSCULAR | Status: DC | PRN
  Administered 2013-08-14: 10 mL via INTRAVENOUS
  Filled 2013-08-14: qty 10

## 2013-08-14 MED ORDER — HEPARIN SOD (PORK) LOCK FLUSH 100 UNIT/ML IV SOLN
500.0000 [IU] | Freq: Once | INTRAVENOUS | Status: AC
  Administered 2013-08-14: 500 [IU] via INTRAVENOUS
  Filled 2013-08-14: qty 5

## 2013-08-14 NOTE — Progress Notes (Signed)
[facility] OFFICE PROGRESS NOTE  LI, NA, MD [address] [place] [address]  DIAGNOSIS: Stage III, (pT4b pN2a M0; 4 out of 15 lymph nodes were positive). ADENOCARCINOMA, COLON - Plan: MR Abdomen W Wo Contrast, Ambulatory referral to Genetics, CBC with Differential, Comprehensive metabolic panel (Cmet) - [facility], Lactate dehydrogenase (LDH) - [facility], CEA  Chief Complaint  Patient presents with  . ADENOCARCINOMA, COLON    PAST THERAPY: S/p resection and 6 months adjuvant FOLFOX chemotherapy. (02/02/2010 through 07/22/2010).   CURRENT THERAPY: None   INTERVAL HISTORY: Diane Erickson 53 y.o. 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 She underwent a colonoscopy by Dr. Teena Erickson on 12/13/2009 with biopsy showing adenocarcinoma. Preoperative CEA was 73.7. 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 Armandina Erickson. The tumor focally involved the pancreas. 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 There was focal lymphovascular invasion, but no perineural invasion. All margins were negative. 4 out of 15 lymph nodes were positive. The patient received 6 months of adjuvant FOLFOX in combination with Neulasta from 02/02/2010 through 07/22/2010. 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  She was last seen by me on 06/13/2013. She is here for follow-up alone.  Her appetite is stable.  She has has an interim CT of abdomen in May 19, 2013 followed by a MRI of abdomen concerning for liver metastases.  She had a ultrasound guided biopsy that showed negative for malignancy.  She had imaging scheduled for 08/13/2013 but was unable to make it due to family obligations at home.  She denies hematochezia or melana or  changes in stool quality. She had her mammogram on 01/30 which was negative for malignancy. She did make it to her genetic referral appointment last visit.   MEDICAL HISTORY: Past Medical History  Diagnosis Date  . Colon cancer     colon ca dx 8/11   INTERIM HISTORY: has ADENOCARCINOMA, COLON and hyperlipidemia on her problem list.    ALLERGIES:  has No Known Allergies.  MEDICATIONS: has a current medication list which includes the following prescription(s): lidocaine-prilocaine.  SURGICAL HISTORY:  Past Surgical History  Procedure Laterality Date  . Colon surgery      left colectomy, distal pancreatectomy, splenectomy  . Tubal ligation      1997   REVIEW OF SYSTEMS:   Constitutional: Denies fevers, chills or abnormal weight loss Eyes: Denies blurriness of vision Ears, nose, mouth, throat, and face: Denies mucositis or sore throat Respiratory: Denies cough, dyspnea or wheezes Cardiovascular: Denies palpitation, chest discomfort or lower extremity swelling Gastrointestinal:  Denies nausea, heartburn or change in bowel habits Skin: Denies abnormal skin rashes Lymphatics: Denies new lymphadenopathy or easy bruising Neurological:Denies numbness, tingling or new weaknesses Behavioral/Psych: Mood is stable, no new changes  All other systems were reviewed with the patient and are negative.  PHYSICAL EXAMINATION: ECOG PERFORMANCE STATUS: 0 - Asymptomatic  Blood pressure 125/68, pulse 75, temperature 97.5 F (36.4 C), temperature source Oral, resp. rate 18, height 5\' 6"  (1.676 m), weight 153 lb 3.2 oz (69.491 kg), last menstrual period 04/21/2012.  GENERAL:alert, no distress and comfortable; mildly overweight.  SKIN: skin color, texture, turgor are normal, no rashes or significant lesions; + L portacath EYES: normal, Conjunctiva are pink and non-injected, sclera clear OROPHARYNX:no exudate, no erythema and  lips, buccal mucosa, and tongue normal  NECK: supple, thyroid normal size,  non-tender, without nodularity LYMPH:  no palpable lymphadenopathy in the cervical, axillary or supraclavicular LUNGS: clear to auscultation and percussion with normal breathing effort HEART: regular rate & rhythm and no murmurs and no lower extremity edema ABDOMEN:abdomen soft, non-tender and normal bowel sounds; mildline scar well healed.  Musculoskeletal:no cyanosis of digits and no clubbing  NEURO: alert & oriented x 3 with fluent speech, no focal motor/sensory deficits  LABORATORY DATA: No results found for this or any previous visit (from the past 48 hour(s)).  Labs:  Lab Results  Component Value Date   WBC 7.4 06/05/2013   HGB 14.2 06/05/2013   HCT 41.6 06/05/2013   MCV 92.4 06/05/2013   PLT 240 06/05/2013   NEUTROABS 3.0 05/05/2013      Chemistry      Component Value Date/Time   NA 141 05/05/2013 0852   NA 139 11/22/2011 1114   NA 137 04/19/2011 1545   K 4.6 05/05/2013 0852   K 3.7 11/22/2011 1114   K 4.7 04/19/2011 1545   CL 105 09/02/2012 1029   CL 105 11/22/2011 1114   CL 103 04/19/2011 1545   CO2 27 05/05/2013 0852   CO2 26 11/22/2011 1114   CO2 30 04/19/2011 1545   BUN 11.4 05/05/2013 0852   BUN 11 11/22/2011 1114   BUN 16 04/19/2011 1545   CREATININE 0.8 05/05/2013 0852   CREATININE 0.53 11/22/2011 1114   CREATININE 0.6 04/19/2011 1545      Component Value Date/Time   CALCIUM 9.8 05/05/2013 0852   CALCIUM 9.2 11/22/2011 1114   CALCIUM 8.6 04/19/2011 1545   ALKPHOS 43 05/05/2013 0852   ALKPHOS 36* 11/22/2011 1114   ALKPHOS 68 04/19/2011 1545   AST 18 05/05/2013 0852   AST 17 11/22/2011 1114   AST 24 04/19/2011 1545   ALT 11 05/05/2013 0852   ALT 9 11/22/2011 1114   ALT 19 04/19/2011 1545   BILITOT 0.71 05/05/2013 0852   BILITOT 0.4 11/22/2011 1114   BILITOT 0.40 04/19/2011 1545     Results for Diane Erickson (MRN [id number]) as of 06/15/2013 09:55  Ref. Range 05/20/2012 14:53 09/02/2012 10:29 12/30/2012 13:31 05/05/2013 08:52  CEA Latest Range: 0.0-5.0 ng/mL 3.4  3.2 2.9 2.9   RADIOGRAPHIC STUDIES: 05/19/2013 CT ABDOMEN AND PELVIS WITH CONTRAST  TECHNIQUE: Multidetector CT imaging of the abdomen and pelvis was performed using the standard protocol following bolus administration of intravenous contrast. CONTRAST: 142mL OMNIPAQUE IOHEXOL 300 MG/ML SOLN COMPARISON: 05/20/2012. FINDINGS: Lower Chest: Scarring at the left lung base. Normal heart size without pericardial or pleural effusion.  Abdomen/Pelvis: Hepatic steatosis. A focus of hyper enhancement in the pericholecystic region of the right lobe of the liver measures 1.1 cm on image 20. Not readily apparent on the prior. Splenectomy. Normal pancreas, gallbladder, biliary tract, adrenal glands. Upper pole right renal cyst. Mild atrophy involving the upper pole left kidney. A central upper pole left renal Mass measures 3.4 x 2.4 cm, including on image 13/series 4. This has been present back to 12/15/2009 exam and measures similarly on that study. Circumaortic left renal vein. No retroperitoneal or retrocrural adenopathy. Normal colon and terminal ileum. Normal small bowel without abdominal ascites. No pelvic adenopathy. Normal urinary bladder. Right-sided sub serosal fibroid again identified. Less conspicuous than on the prior exam. Otherwise normal uterus, without adnexal mass or significant free pelvic fluid. No evidence of omental or peritoneal disease. Bones/Musculoskeletal: No acute osseous abnormality.  IMPRESSION: 1. Hepatic steatosis with a new hyper attenuating or hyperenhancing focus in the right lobe of the liver. Although this is not typical of hepatic metastasis from colon carcinoma, this cannot be excluded. Consider further characterization with pre and post contrast abdominal MRI. 2. Otherwise, no evidence of metastatic disease. 3. Upper pole left renal mass. This was suspicious for renal cell carcinoma back on the 01/06/2010 MRI. Given its lack of significant interval enlargement, an indolent and possibly  benign lesion is favored. This could be re-evaluated on follow-up MRI.  05/29/2013 MRI ABDOMEN WITHOUT AND WITH CONTRAST  TECHNIQUE: Multiplanar multisequence MR imaging of the abdomen was performed both before and after the administration of intravenous contrast. CONTRAST: 69mL MULTIHANCE GADOBENATE DIMEGLUMINE 529 MG/ML IV SOLN  COMPARISON: 05/19/2013 FINDINGS: No pleural effusion or pericardial effusion noted. There are no suspicious liver abnormalities to suggest metastatic disease. There are at least 4 hyperenhancing liver lesions identified within the right lobe of liver. The largest lesion is adjacent to the gallbladder an measures 1.3 cm. These are both relatively isointense  to liver parenchyma on the precontrast T1 weighted images as well as the T2 weighted images. These lesions are not identified on the diffusion-weighted sequences. The smaller of the lesions becomes isoechoic with the adjacent liver parenchyma on the delayed images. The dominant lesion remains slightly hyperintense to the adjacent liver parenchyma on subsequent delayed images. The gallbladder appears normal. No significant biliary dilatation. The pancreas is unremarkable. Spleen is not visualized and may be surgically absent. The adrenal glands are both normal. There is a cyst within the upper pole of the right kidney measuring 1.1 cm, image number 52/series 13001. Within knee upper pole of the left kidney there is a solid,  enhancing mass which measures 3.6 cm, image 59/series 1300 and 1. The E left renal vein appears patent. There is no evidence for retroperitoneal adenopathy identified. Review of the visualized osseous structures shows no evidence for osseous metastatic disease. IMPRESSION: 1. Solid, enhancing mass within the upper pole the left kidney is worrisome for primary renal neoplasm. There is no evidence for renal vein invasion or retroperitoneal, lymph node metastasis. 2. Multi focal hyper enhancing liver lesion are  identified. Findings may reflect benign or malignant liver neoplasms. In a patient who has known malignancy, liver metastasis cannot be excluded. Since  these are hypervascular and may be difficult to identify on  noncontrast CT, consider ultrasound of the liver. If these are  visible on the liver ultrasound then one of these should be  amendable to ultrasound-guided percutaneous biopsy.  PATHOLOGY:  06/05/2013 Diagnosis Liver, needle/core biopsy - BENIGN LIVER.- NO EVIDENCE OF MALIGNANCY, SEE COMMENT.Diagnosis Note  There is no evidence of malignancy and the biopsy is likely not representative of the lesion in question. These findings correlate with those of the liver fine needle aspiration ([id number]).Vicente Males MD Pathologist, Electronic Signature (Case signed 06/06/2013)  ASSESSMENT: Diane Erickson 53 y.o. female with a history of ADENOCARCINOMA, COLON - Plan: MR Abdomen W Wo Contrast, Ambulatory referral to Genetics, CBC with Differential, Comprehensive metabolic panel (Cmet) - [facility], Lactate dehydrogenase (LDH) - [facility], CEA   PLAN:  1. History of adenocarcinoma of colon, Stage III, (pT4b pN2a M0; 4 out of 15 lymph nodes were positive). S/p resection and 6 months adjuvant FOLFOX chemotherapy. (02/02/2010 through 07/22/2010). CEA 2.9 last visit. Await labs today.   -- We repeated a CT of abdomen and pelvis in January 2015 followed by MRI of abdomen as indicated above.  Ultrasound-guided  biopsy was obtained and was negative for malignancy.   We discussed repeating biopsy without versus following up with repeat MRI in 2-3 months to assess change in size. She chose the latter but unfortunately did not have the scan done yesterday.  We will reschedule this scan and have her back in the office in one month to discuss the results.   If further increasing, we will require surgical consultation to determine if positive for adenocarcinoma. She is asymptomatic presently.  Her stools are normal caliber.       --We will also refer for genetic counseling given her age with colon cancer. She has other family members with cancer.   2. Port-a cath.  We will continue flush her Port-A-Cath with with heparin every 2 months. 3. Mammogram is due in January 2016.  4. Follow up in 1 month.  All questions were answered. The patient knows to call the clinic with any problems, questions or concerns. We can certainly see the patient much sooner if necessary.  I spent 15 minutes counseling the patient face to face. The total time spent in the appointment was 410minutes.    Concha Norway, MD 08/14/2013 10:38 AM

## 2013-08-19 ENCOUNTER — Other Ambulatory Visit: Payer: Self-pay | Admitting: Medical Oncology

## 2013-08-28 ENCOUNTER — Encounter (HOSPITAL_COMMUNITY): Payer: Self-pay

## 2013-08-28 ENCOUNTER — Ambulatory Visit (HOSPITAL_COMMUNITY)
Admission: RE | Admit: 2013-08-28 | Discharge: 2013-08-28 | Disposition: A | Discharge status: Home / Self Care | Payer: Medicaid Other | Source: Ambulatory Visit | Attending: Internal Medicine | Admitting: Internal Medicine

## 2013-08-28 DIAGNOSIS — C189 Malignant neoplasm of colon, unspecified: Secondary | ICD-10-CM

## 2013-08-28 DIAGNOSIS — Z85038 Personal history of other malignant neoplasm of large intestine: Secondary | ICD-10-CM | POA: Insufficient documentation

## 2013-08-28 DIAGNOSIS — Z9221 Personal history of antineoplastic chemotherapy: Secondary | ICD-10-CM | POA: Insufficient documentation

## 2013-08-28 MED ORDER — IOHEXOL 300 MG/ML  SOLN
100.0000 mL | Freq: Once | INTRAMUSCULAR | Status: AC | PRN
  Administered 2013-08-28: 100 mL via INTRAVENOUS

## 2013-09-12 ENCOUNTER — Ambulatory Visit: Payer: Medicaid Other

## 2013-09-12 ENCOUNTER — Other Ambulatory Visit: Payer: Medicaid Other

## 2013-10-01 ENCOUNTER — Encounter: Payer: Self-pay | Admitting: Internal Medicine

## 2013-10-01 ENCOUNTER — Ambulatory Visit (INDEPENDENT_AMBULATORY_CARE_PROVIDER_SITE_OTHER): Payer: Medicaid Other | Admitting: Internal Medicine

## 2013-10-01 ENCOUNTER — Telehealth: Payer: Self-pay | Admitting: *Deleted

## 2013-10-01 VITALS — BP 106/79 | HR 93 | Temp 98.6°F | Ht 66.0 in | Wt 146.6 lb

## 2013-10-01 DIAGNOSIS — N390 Urinary tract infection, site not specified: Secondary | ICD-10-CM | POA: Insufficient documentation

## 2013-10-01 LAB — POCT URINALYSIS DIPSTICK
Glucose, UA: NEGATIVE
Nitrite, UA: NEGATIVE
PH UA: 6
SPEC GRAV UA: 1.025
Urobilinogen, UA: 0.2

## 2013-10-01 MED ORDER — CIPROFLOXACIN HCL 250 MG PO TABS: 250.0000 mg | ORAL_TABLET | Freq: Two times a day (BID) | ORAL | Status: DC

## 2013-10-01 NOTE — Progress Notes (Signed)
   Subjective:    Patient ID: Diane Erickson, female    DOB: 18-Nov-1960, 53 y.o.   MRN: [id number]  HPI  Diane Erickson is a 53 y.o. female PMH adenocarcinoma of the colon, stage III (pT4b pN2a M0; 4 out of 15 lymph nodes were positive), s/p left colectomy with distal pancreatectomy on 12/14/2009, s/p 6 months of adjuvant FOLFOX with Neulasta support from 01/02/2010 through 07/22/2010. She follows with oncology, Dr. Juliann Mule, last visit 08/14/13.  Today she presents for an acute visit. Patient reports fevers (103 yesterday, 101 this morning), bodyaches, and hematuria starting Friday. She denies dysuria but admits to crampy pain in her lower abdomen and increased urinary frequency. She denies nausea, vomiting, diarrhea but has had some anorexia since Monday. She denies back or flank pain.   Current Outpatient Prescriptions on File Prior to Visit  Medication Sig Dispense Refill  . lidocaine-prilocaine (EMLA) cream Apply 1 application topically as needed. Apply to port site one hour before tx and cover with plastic wrap.  30 g  1    Review of Systems  Constitutional: Positive for fever and chills. Negative for appetite change.  Respiratory: Negative for shortness of breath.   Cardiovascular: Negative for chest pain.  Gastrointestinal: Positive for abdominal pain (Suprapubic). Negative for nausea, vomiting and diarrhea.  Genitourinary: Positive for frequency and hematuria. Negative for dysuria.  Musculoskeletal: Negative for back pain.       Objective:   Physical Exam  Constitutional: She is oriented to person, place, and time. She appears well-developed and well-nourished.  HENT:  Head: Normocephalic and atraumatic.  Eyes: Conjunctivae and EOM are normal. Pupils are equal, round, and reactive to light.  Neck: Normal range of motion. Neck supple.  Cardiovascular: Normal rate and regular rhythm.   No murmur heard. Pulmonary/Chest: Effort normal and breath sounds normal.  Abdominal: Soft. Bowel  sounds are normal. She exhibits no distension and no mass. There is tenderness (Mild, suprapubic area). There is no rebound, no guarding and no CVA tenderness.  Musculoskeletal: Normal range of motion. She exhibits no edema and no tenderness.  Neurological: She is alert and oriented to person, place, and time. No cranial nerve deficit.  Skin: Skin is warm and dry.  Psychiatric: She has a normal mood and affect.    Filed Vitals:   10/01/13 1338  BP: 106/79  Pulse: 93  Temp: 98.6 F (37 C)      Assessment & Plan:   Please see problem based charting.

## 2013-10-01 NOTE — Telephone Encounter (Signed)
   Provider input needed: Fever, hematuria   Reason for call: noted blood in urine Friday thru Sunday  Constitutional: positive for fevers Genitourinary:positive for hematuria    ALLERGIES:  has No Known Allergies.  Patient last received chemotherapy/ treatment on 2012  Patient was last seen in the office on 08-14-2013  Next appt is none  Is patient having fevers greater than 100.5?  yes, 103 yesterday, 101 this am   Is patient having uncontrolled pain, or new pain? yes, body aches   Is patient having new back pain that changes with position (worsens or eases when laying down?)  n/a   Is patient able to eat and drink? n/a    Is patient able to pass stool without difficulty?   na     Is patient having uncontrolled nausea?  na    patient calls 10/01/2013 with complaint of  Constitutional: positive for fevers and patient relates that she had a fever of 103 yesterday and then 101 at 5am this morning. Genitourinary:positive for hematuria and patient reports that she had blood in her urine Friday, Saturday and Sunday, however  none since Sunday.   Summary Based on the above information advised patient to discussed with Dr. Juliann Mule and advised to patient to go to Urgent Care or ED.  She does not have transportation but is able to take a bus.  asked patient if she felt well enought to take the bus and she does..  Advised her to call 911 if she is unable to get transportation.  Let her know that she needs to be seen today for this.   Diane Erickson  10/01/2013, 9:02 AM   Background Info  Ray Glacken   DOB: June 08, 1960   MR#: [id number]   CSN#   [id number] 10/01/2013

## 2013-10-01 NOTE — Assessment & Plan Note (Addendum)
Patient presents with hematuria for 5 days, associated with increased urinary frequency and suprapubic tenderness. Also with subjective fevers and anorexia for 2 days. Urinalysis shows large blood, large leukocytes. She most likely has a urinary tract infection. UA also notable for new proteinuria, likely 2/2 UTI, but it would be worth repeating her UA at her next visit to ensure this was transient and not representative of glomerular injury. - Ciprofloxacin 250 mg twice a day x3 days - Patient will return next week if symptoms do not resolve, or if she develops back or flank pain - Routine follow up - Repeat UA next visit

## 2013-10-01 NOTE — Patient Instructions (Signed)
Thank you for your visit. - You likely have a urinary tract infection. I have prescribed an antibiotic called ciprofloxacin. Please take this twice a day for 3 days. - If you continue to experience blood in your urine, lower abdominal pain, fever, pain in your back or flanks, please come back to the clinic next week.

## 2013-10-16 NOTE — Progress Notes (Signed)
Case discussed with Dr. Cater at the time of the visit.  We reviewed the resident's history and exam and pertinent patient test results.  I agree with the assessment, diagnosis, and plan of care documented in the resident's note.      

## 2013-12-15 ENCOUNTER — Telehealth: Payer: Self-pay | Admitting: Medical Oncology

## 2013-12-15 ENCOUNTER — Other Ambulatory Visit: Payer: Self-pay | Admitting: Medical Oncology

## 2013-12-15 NOTE — Telephone Encounter (Signed)
Pt called asking why Dr. Juliann Mule did not make her an appointment to come for port flushing. I explained that she missed her appointments in May with Dr. Juliann Mule and with genetics. She explained she was sick and could not come. I told her I will make her a follow up appointment to come in and see Dr. Juliann Mule. She requested early morning and if she does not like her time she will have to reschedule. She also asked if we could mail her appointment. I told we could. I stressed the importance of keeping her appointments so we make sure her cancer does not return. She states she understands.

## 2013-12-17 ENCOUNTER — Telehealth: Payer: Self-pay | Admitting: Internal Medicine

## 2013-12-17 NOTE — Telephone Encounter (Signed)
CALED TO GIVE 8/17 APPT. PHN RANG CONTINUOUSLY WITH NO ANSWER/NO VM. MAILED APPT CALENDAR.

## 2013-12-22 ENCOUNTER — Other Ambulatory Visit (HOSPITAL_BASED_OUTPATIENT_CLINIC_OR_DEPARTMENT_OTHER): Payer: Medicaid Other

## 2013-12-22 ENCOUNTER — Telehealth: Payer: Self-pay | Admitting: Internal Medicine

## 2013-12-22 ENCOUNTER — Ambulatory Visit (HOSPITAL_BASED_OUTPATIENT_CLINIC_OR_DEPARTMENT_OTHER): Payer: Medicaid Other | Admitting: Internal Medicine

## 2013-12-22 ENCOUNTER — Encounter: Payer: Self-pay | Admitting: Internal Medicine

## 2013-12-22 VITALS — BP 149/95 | HR 93 | Temp 98.1°F | Resp 19 | Ht 66.0 in | Wt 144.8 lb

## 2013-12-22 DIAGNOSIS — C779 Secondary and unspecified malignant neoplasm of lymph node, unspecified: Secondary | ICD-10-CM

## 2013-12-22 DIAGNOSIS — C185 Malignant neoplasm of splenic flexure: Secondary | ICD-10-CM

## 2013-12-22 DIAGNOSIS — C189 Malignant neoplasm of colon, unspecified: Secondary | ICD-10-CM

## 2013-12-22 DIAGNOSIS — D509 Iron deficiency anemia, unspecified: Secondary | ICD-10-CM

## 2013-12-22 LAB — CBC WITH DIFFERENTIAL/PLATELET
BASO%: 0.4 % (ref 0.0–2.0)
BASOS ABS: 0 10*3/uL (ref 0.0–0.1)
EOS%: 1.1 % (ref 0.0–7.0)
Eosinophils Absolute: 0.1 10*3/uL (ref 0.0–0.5)
HEMATOCRIT: 38.6 % (ref 34.8–46.6)
HEMOGLOBIN: 13.4 g/dL (ref 11.6–15.9)
LYMPH%: 30.2 % (ref 14.0–49.7)
MCH: 31.2 pg (ref 25.1–34.0)
MCHC: 34.7 g/dL (ref 31.5–36.0)
MCV: 90 fL (ref 79.5–101.0)
MONO#: 0.5 10*3/uL (ref 0.1–0.9)
MONO%: 8.6 % (ref 0.0–14.0)
NEUT#: 3.3 10*3/uL (ref 1.5–6.5)
NEUT%: 59.7 % (ref 38.4–76.8)
PLATELETS: 258 10*3/uL (ref 145–400)
RBC: 4.29 10*6/uL (ref 3.70–5.45)
RDW: 14.9 % — ABNORMAL HIGH (ref 11.2–14.5)
WBC: 5.6 10*3/uL (ref 3.9–10.3)
lymph#: 1.7 10*3/uL (ref 0.9–3.3)
nRBC: 0 % (ref 0–0)

## 2013-12-22 LAB — COMPREHENSIVE METABOLIC PANEL (CC13)
ALBUMIN: 4.2 g/dL (ref 3.5–5.0)
ALT: 16 U/L (ref 0–55)
AST: 29 U/L (ref 5–34)
Alkaline Phosphatase: 49 U/L (ref 40–150)
Anion Gap: 14 mEq/L — ABNORMAL HIGH (ref 3–11)
BUN: 11 mg/dL (ref 7.0–26.0)
CHLORIDE: 106 meq/L (ref 98–109)
CO2: 23 mEq/L (ref 22–29)
CREATININE: 0.7 mg/dL (ref 0.6–1.1)
Calcium: 9.5 mg/dL (ref 8.4–10.4)
GLUCOSE: 74 mg/dL (ref 70–140)
Potassium: 4.7 mEq/L (ref 3.5–5.1)
Sodium: 142 mEq/L (ref 136–145)
Total Bilirubin: 0.37 mg/dL (ref 0.20–1.20)
Total Protein: 8 g/dL (ref 6.4–8.3)

## 2013-12-22 LAB — LACTATE DEHYDROGENASE (CC13): LDH: 200 U/L (ref 125–245)

## 2013-12-22 LAB — CEA: CEA: 3.3 ng/mL (ref 0.0–5.0)

## 2013-12-22 NOTE — Progress Notes (Signed)
[name] OFFICE PROGRESS NOTE  Moding, Karle Starch, MD [address]  [place] [address]  DIAGNOSIS: Stage III, (pT4b pN2a M0; 4 out of 15 lymph nodes were positive). ADENOCARCINOMA, COLON - Plan: MR Abdomen W Contrast, CBC with Differential, Comprehensive metabolic panel (Cmet) - [facility], Lactate dehydrogenase (LDH) - [facility], CEA  Chief Complaint  Patient presents with  . ADENOCARCINOMA, COLON    PAST THERAPY: S/p resection and 6 months adjuvant FOLFOX chemotherapy. (02/02/2010 through 07/22/2010).   CURRENT THERAPY: None   INTERVAL HISTORY: Diane Erickson 53 y.o. 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   As previously reported, she underwent a colonoscopy by Dr. Teena Erickson on 12/13/2009 with biopsy showing adenocarcinoma. Preoperative CEA was 73.7. 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 Armandina Erickson. The tumor focally involved the pancreas. 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 There was focal lymphovascular invasion, but no perineural invasion. All margins were negative. 4 out of 15 lymph nodes were positive. The patient received 6 months of adjuvant FOLFOX in combination with Neulasta from 02/02/2010 through 07/22/2010. 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  She was last seen by me on 08/14/2013. She is here for follow-up alone.  Her appetite is stable.  She has has an interim CT of abdomen in August 28, 2013.  As noted below, she had a ultrasound guided biopsy that showed negative for malignancy.   She denies hematochezia or melana or changes in stool quality. She had her mammogram on 01/30 which was negative for malignancy. She did not make it to her genetic referral appointment.    MEDICAL HISTORY: Past Medical History  Diagnosis Date  . Colon cancer     colon ca dx 8/11   INTERIM HISTORY: has ADENOCARCINOMA, COLON; hyperlipidemia; and Urinary tract infection on her problem list.    ALLERGIES:  has No Known Allergies.  MEDICATIONS: has a current medication list which includes the following prescription(s): lidocaine-prilocaine.  SURGICAL HISTORY:  Past Surgical History  Procedure Laterality Date  . Colon surgery      left colectomy, distal pancreatectomy, splenectomy  . Tubal ligation      1997   REVIEW OF SYSTEMS:   Constitutional: Denies fevers, chills or abnormal weight loss Eyes: Denies blurriness of vision Ears, nose, mouth, throat, and face: Denies mucositis or sore throat Respiratory: Denies cough, dyspnea or wheezes Cardiovascular: Denies palpitation, chest discomfort or lower extremity swelling Gastrointestinal:  Denies nausea, heartburn or change in bowel habits Skin: Denies abnormal skin rashes Lymphatics: Denies new lymphadenopathy or easy bruising Neurological:Denies numbness, tingling or new weaknesses Behavioral/Psych: Mood is stable, no new changes  All other systems were reviewed with the patient and are negative.  PHYSICAL EXAMINATION: ECOG PERFORMANCE STATUS: 0 - Asymptomatic  Blood pressure 149/95, pulse 93, temperature 98.1 F (36.7 C), temperature source Oral, resp. rate 19, height 5\' 6"  (1.676 m), weight 144 lb 12.8 oz (65.681 kg), last menstrual period 04/21/2012, SpO2 100.00%.  GENERAL:alert, no distress and comfortable; mildly overweight.  SKIN: skin color, texture, turgor are normal, no rashes or significant lesions; + L portacath EYES: normal, Conjunctiva are pink and non-injected, sclera clear OROPHARYNX:no exudate, no erythema and lips, buccal mucosa, and tongue normal  NECK: supple, thyroid normal size, non-tender, without nodularity LYMPH:  no palpable lymphadenopathy in  the cervical, axillary or  supraclavicular LUNGS: clear to auscultation and percussion with normal breathing effort HEART: regular rate & rhythm and no murmurs and no lower extremity edema ABDOMEN:abdomen soft, non-tender and normal bowel sounds; mildline scar well healed.  Musculoskeletal:no cyanosis of digits and no clubbing  NEURO: alert & oriented x 3 with fluent speech, no focal motor/sensory deficits  LABORATORY DATA: Results for orders placed in visit on 12/22/13 (from the past 48 hour(s))  CBC WITH DIFFERENTIAL     Status: Abnormal   Collection Time    12/22/13  9:25 AM      Result Value Ref Range   WBC 5.6  3.9 - 10.3 10e3/uL   NEUT# 3.3  1.5 - 6.5 10e3/uL   HGB 13.4  11.6 - 15.9 g/dL   HCT 38.6  34.8 - 46.6 %   Platelets 258  145 - 400 10e3/uL   MCV 90.0  79.5 - 101.0 fL   MCH 31.2  25.1 - 34.0 pg   MCHC 34.7  31.5 - 36.0 g/dL   RBC 4.29  3.70 - 5.45 10e6/uL   RDW 14.9 (*) 11.2 - 14.5 %   lymph# 1.7  0.9 - 3.3 10e3/uL   MONO# 0.5  0.1 - 0.9 10e3/uL   Eosinophils Absolute 0.1  0.0 - 0.5 10e3/uL   Basophils Absolute 0.0  0.0 - 0.1 10e3/uL   NEUT% 59.7  38.4 - 76.8 %   LYMPH% 30.2  14.0 - 49.7 %   MONO% 8.6  0.0 - 14.0 %   EOS% 1.1  0.0 - 7.0 %   BASO% 0.4  0.0 - 2.0 %   nRBC 0  0 - 0 %  LACTATE DEHYDROGENASE (CC13)     Status: None   Collection Time    12/22/13  9:25 AM      Result Value Ref Range   LDH 200  125 - 245 U/L  COMPREHENSIVE METABOLIC PANEL (MB55)     Status: Abnormal   Collection Time    12/22/13  9:25 AM      Result Value Ref Range   Sodium 142  136 - 145 mEq/L   Potassium 4.7  3.5 - 5.1 mEq/L   Chloride 106  98 - 109 mEq/L   CO2 23  22 - 29 mEq/L   Glucose 74  70 - 140 mg/dl   BUN 11.0  7.0 - 26.0 mg/dL   Creatinine 0.7  0.6 - 1.1 mg/dL   Total Bilirubin 0.37  0.20 - 1.20 mg/dL   Alkaline Phosphatase 49  40 - 150 U/L   AST 29  5 - 34 U/L   ALT 16  0 - 55 U/L   Total Protein 8.0  6.4 - 8.3 g/dL   Albumin 4.2  3.5 - 5.0 g/dL   Calcium 9.5  8.4 - 10.4 mg/dL   Anion  Gap 14 (*) 3 - 11 mEq/L    Labs:  Lab Results  Component Value Date   WBC 5.6 12/22/2013   HGB 13.4 12/22/2013   HCT 38.6 12/22/2013   MCV 90.0 12/22/2013   PLT 258 12/22/2013   NEUTROABS 3.3 12/22/2013      Chemistry      Component Value Date/Time   NA 142 12/22/2013 0925   NA 139 11/22/2011 1114   NA 137 04/19/2011 1545   K 4.7 12/22/2013 0925   K 3.7 11/22/2011 1114   K 4.7 04/19/2011 1545   CL 105 09/02/2012 1029   CL  105 11/22/2011 1114   CL 103 04/19/2011 1545   CO2 23 12/22/2013 0925   CO2 26 11/22/2011 1114   CO2 30 04/19/2011 1545   BUN 11.0 12/22/2013 0925   BUN 11 11/22/2011 1114   BUN 16 04/19/2011 1545   CREATININE 0.7 12/22/2013 0925   CREATININE 0.53 11/22/2011 1114   CREATININE 0.6 04/19/2011 1545      Component Value Date/Time   CALCIUM 9.5 12/22/2013 0925   CALCIUM 9.2 11/22/2011 1114   CALCIUM 8.6 04/19/2011 1545   ALKPHOS 49 12/22/2013 0925   ALKPHOS 36* 11/22/2011 1114   ALKPHOS 68 04/19/2011 1545   AST 29 12/22/2013 0925   AST 17 11/22/2011 1114   AST 24 04/19/2011 1545   ALT 16 12/22/2013 0925   ALT 9 11/22/2011 1114   ALT 19 04/19/2011 1545   BILITOT 0.37 12/22/2013 0925   BILITOT 0.4 11/22/2011 1114   BILITOT 0.40 04/19/2011 1545     Results for Diane Erickson, Diane Erickson (MRN [id number]) as of 06/15/2013 09:55  Ref. Range 05/20/2012 14:53 09/02/2012 10:29 12/30/2012 13:31 05/05/2013 08:52  CEA Latest Range: 0.0-5.0 ng/mL 3.4 3.2 2.9 2.9   RADIOGRAPHIC STUDIES: 08/28/2013 Colon cancer diagnosed 8/11. Chemotherapy complete.  EXAM: CT ABDOMEN WITH CONTRAST TECHNIQUE:  Multidetector CT imaging of the abdomen was performed using the standard protocol following bolus administration of intravenous contrast. CONTRAST: 126mL OMNIPAQUE IOHEXOL 300 MG/ML SOLN COMPARISON: US BIOPSY dated 06/05/2013; MR ABDOMEN WO/W CM dated  05/29/2013; CT ABD/PELVIS W CM dated 05/19/2013; CT ABD/PELVIS W CM dated 05/20/2012; CT ABD/PELVIS W CM dated 08/17/2010; CT ABD/PELVIS W CM dated 12/15/2009 FINDINGS:  Lower Chest: Mild scarring at the left lung base. Heart size upper normal, without pericardial or pleural effusion. Abdomen: Mild hepatic steatosis. Hyperenhancing hepatic focus adjacent the gallbladder measures 1.2 cm on image 21, similar. A smaller hyperenhancing focus in the right lobe on image 22 is also felt to be similar. No new liver lesions. Splenectomy. Normal stomach, pancreas, gallbladder, biliary tract, adrenal glands. An upper pole right renal cyst. Upper pole left renal mass. 3.4 x 2.2 cm on image 11, of series 5, unchanged. At least partially duplicated right renal collecting system. Too small to characterize interpolar left renal lesion. Aortic atherosclerosis. No retroperitoneal or retrocrural adenopathy. Normal abdominal bowel loops, without ascites. No evidence of omental or peritoneal disease. Bones/Musculoskeletal: No acute osseous abnormality. IMPRESSION: 1. Similar hyperenhancing foci within the right lobe of the liver. These were better evaluated on 05/29/2013 MRI. 2. Otherwise, no evidence of metastatic disease within the abdomen. 3. Upper pole left renal mass, similar. This remains suspicious for a relatively indolent neoplasm.   PATHOLOGY:  06/05/2013 Diagnosis Liver, needle/core biopsy - BENIGN LIVER.- NO EVIDENCE OF MALIGNANCY, SEE COMMENT.Diagnosis Note  There is no evidence of malignancy and the biopsy is likely not representative of the lesion in question. These findings correlate with those of the liver fine needle aspiration ([id number]).Vicente Males MD Pathologist, Electronic Signature (Case signed 06/06/2013)  ASSESSMENT: Diane Erickson 53 y.o. female with a history of ADENOCARCINOMA, COLON - Plan: MR Abdomen W Contrast, CBC with Differential, Comprehensive metabolic panel (Cmet) - [facility], Lactate dehydrogenase (LDH) - [facility], CEA   PLAN:  1. History of adenocarcinoma of colon, Stage III, (pT4b pN2a M0; 4 out of 15 lymph nodes were positive). S/p resection and 6 months adjuvant  FOLFOX chemotherapy. (02/02/2010 through 07/22/2010). CEA 2.7 last visit. Await CEA today.   -- We repeated a CT of abdomen and pelvis in April 2015  as  indicated above.  Ultrasound-guided biopsy was obtained and was negative for malignancy.   Last visit, we discussed repeating biopsy  versus following up with repeat MRI in 2-3 months to assess change in size. We will repeat MRI to assess both liver lesion and right renal lesion as well.  We will reschedule this scan and have her back in the office as needed to discuss the results.   If further increasing, we will require surgical consultation to determine if positive for adenocarcinoma. She is asymptomatic presently.  Her stools are normal caliber.      --We referred her to genetic counseling given her age with colon cancer. She has other family members with cancer. She missed the appointments and will need to have rescheduled.   2. Port-a cath.  We will continue flush her Port-A-Cath with with heparin every 2 months. 3. Mammogram is due in January 2016.  4. Follow up in 3 months or sooner depending on her MRI of abdomen results.  All questions were answered. The patient knows to call the clinic with any problems, questions or concerns. We can certainly see the patient much sooner if necessary.  I spent 15 minutes counseling the patient face to face. The total time spent in the appointment was 25 minutes.    Landin Tallon, MD 12/22/2013 1:44 PM

## 2013-12-22 NOTE — Telephone Encounter (Signed)
gv and printed appt sched adn avs for pt for NOV °

## 2013-12-24 ENCOUNTER — Telehealth: Payer: Self-pay | Admitting: Internal Medicine

## 2013-12-26 ENCOUNTER — Telehealth: Payer: Self-pay | Admitting: Internal Medicine

## 2013-12-26 NOTE — Telephone Encounter (Signed)
no answer and no vm....mailed pt appt sched/avs and letter

## 2014-01-02 ENCOUNTER — Ambulatory Visit (HOSPITAL_COMMUNITY): Admission: RE | Admit: 2014-01-02 | Payer: Self-pay | Source: Ambulatory Visit

## 2014-02-27 ENCOUNTER — Ambulatory Visit (HOSPITAL_BASED_OUTPATIENT_CLINIC_OR_DEPARTMENT_OTHER): Payer: Medicaid Other

## 2014-02-27 VITALS — BP 124/78 | HR 63 | Temp 97.4°F

## 2014-02-27 DIAGNOSIS — C185 Malignant neoplasm of splenic flexure: Secondary | ICD-10-CM

## 2014-02-27 DIAGNOSIS — Z95828 Presence of other vascular implants and grafts: Secondary | ICD-10-CM

## 2014-02-27 DIAGNOSIS — Z452 Encounter for adjustment and management of vascular access device: Secondary | ICD-10-CM

## 2014-02-27 MED ORDER — SODIUM CHLORIDE 0.9 % IJ SOLN
10.0000 mL | INTRAMUSCULAR | Status: DC | PRN
  Administered 2014-02-27: 10 mL via INTRAVENOUS
  Filled 2014-02-27: qty 10

## 2014-02-27 MED ORDER — HEPARIN SOD (PORK) LOCK FLUSH 100 UNIT/ML IV SOLN
500.0000 [IU] | Freq: Once | INTRAVENOUS | Status: AC
  Administered 2014-02-27: 500 [IU] via INTRAVENOUS
  Filled 2014-02-27: qty 5

## 2014-02-27 NOTE — Patient Instructions (Signed)

## 2014-03-09 ENCOUNTER — Encounter: Payer: Self-pay | Admitting: Internal Medicine

## 2014-03-13 ENCOUNTER — Other Ambulatory Visit: Payer: Self-pay | Admitting: *Deleted

## 2014-03-13 DIAGNOSIS — C189 Malignant neoplasm of colon, unspecified: Secondary | ICD-10-CM

## 2014-03-13 DIAGNOSIS — E785 Hyperlipidemia, unspecified: Secondary | ICD-10-CM

## 2014-03-16 ENCOUNTER — Other Ambulatory Visit: Payer: Self-pay

## 2014-03-16 ENCOUNTER — Ambulatory Visit: Payer: Self-pay

## 2014-03-19 ENCOUNTER — Telehealth: Payer: Self-pay | Admitting: Hematology

## 2014-03-19 NOTE — Telephone Encounter (Signed)
Pt called back s/w nurse to r/s apt, r/s to 11/20 per staff msg per pt's request pt is aware...Marland KitchenMarland KitchenMarland Kitchen KJ

## 2014-03-19 NOTE — Telephone Encounter (Signed)
Pt called states she called earlier to cancel her apt from 10/09 labs/ov due to son was sick, pt confirmed labs/ov .Marland Kitchen... KJ

## 2014-03-26 ENCOUNTER — Telehealth: Payer: Self-pay | Admitting: Hematology

## 2014-03-26 NOTE — Telephone Encounter (Signed)
No voice mail to confirm appt still for 03/27/14 but w/ YF.

## 2014-03-27 ENCOUNTER — Encounter: Payer: Self-pay | Admitting: Hematology

## 2014-03-27 ENCOUNTER — Ambulatory Visit (HOSPITAL_BASED_OUTPATIENT_CLINIC_OR_DEPARTMENT_OTHER): Payer: Medicaid Other | Admitting: Hematology

## 2014-03-27 ENCOUNTER — Other Ambulatory Visit (HOSPITAL_BASED_OUTPATIENT_CLINIC_OR_DEPARTMENT_OTHER): Payer: Medicaid Other

## 2014-03-27 ENCOUNTER — Telehealth: Payer: Self-pay | Admitting: Hematology

## 2014-03-27 VITALS — BP 140/88 | HR 70 | Temp 97.8°F | Resp 18 | Ht 66.0 in | Wt 149.6 lb

## 2014-03-27 DIAGNOSIS — C189 Malignant neoplasm of colon, unspecified: Secondary | ICD-10-CM

## 2014-03-27 DIAGNOSIS — Z85038 Personal history of other malignant neoplasm of large intestine: Secondary | ICD-10-CM | POA: Insufficient documentation

## 2014-03-27 DIAGNOSIS — N289 Disorder of kidney and ureter, unspecified: Secondary | ICD-10-CM

## 2014-03-27 DIAGNOSIS — R932 Abnormal findings on diagnostic imaging of liver and biliary tract: Secondary | ICD-10-CM

## 2014-03-27 LAB — CBC WITH DIFFERENTIAL/PLATELET
BASO%: 0.4 % (ref 0.0–2.0)
Basophils Absolute: 0 10*3/uL (ref 0.0–0.1)
EOS%: 1 % (ref 0.0–7.0)
Eosinophils Absolute: 0.1 10*3/uL (ref 0.0–0.5)
HEMATOCRIT: 39.4 % (ref 34.8–46.6)
HGB: 12.9 g/dL (ref 11.6–15.9)
LYMPH#: 2 10*3/uL (ref 0.9–3.3)
LYMPH%: 30 % (ref 14.0–49.7)
MCH: 31.1 pg (ref 25.1–34.0)
MCHC: 32.7 g/dL (ref 31.5–36.0)
MCV: 94.9 fL (ref 79.5–101.0)
MONO#: 0.7 10*3/uL (ref 0.1–0.9)
MONO%: 10 % (ref 0.0–14.0)
NEUT#: 3.9 10*3/uL (ref 1.5–6.5)
NEUT%: 58.6 % (ref 38.4–76.8)
Platelets: 323 10*3/uL (ref 145–400)
RBC: 4.15 10*6/uL (ref 3.70–5.45)
RDW: 14.9 % — AB (ref 11.2–14.5)
WBC: 6.7 10*3/uL (ref 3.9–10.3)

## 2014-03-27 LAB — CEA: CEA: 2.8 ng/mL (ref 0.0–5.0)

## 2014-03-27 LAB — COMPREHENSIVE METABOLIC PANEL (CC13)
ALBUMIN: 3.9 g/dL (ref 3.5–5.0)
ALT: 9 U/L (ref 0–55)
ANION GAP: 11 meq/L (ref 3–11)
AST: 17 U/L (ref 5–34)
Alkaline Phosphatase: 49 U/L (ref 40–150)
BUN: 9.9 mg/dL (ref 7.0–26.0)
CALCIUM: 9.2 mg/dL (ref 8.4–10.4)
CHLORIDE: 105 meq/L (ref 98–109)
CO2: 26 meq/L (ref 22–29)
Creatinine: 0.7 mg/dL (ref 0.6–1.1)
Glucose: 84 mg/dl (ref 70–140)
POTASSIUM: 4.2 meq/L (ref 3.5–5.1)
SODIUM: 141 meq/L (ref 136–145)
TOTAL PROTEIN: 7.6 g/dL (ref 6.4–8.3)
Total Bilirubin: 0.43 mg/dL (ref 0.20–1.20)

## 2014-03-27 LAB — LACTATE DEHYDROGENASE (CC13): LDH: 157 U/L (ref 125–245)

## 2014-03-27 NOTE — Telephone Encounter (Signed)
Pt confirmed labs/ov per 11/20 POF, gave pt AVS..... KJ  °

## 2014-03-27 NOTE — Progress Notes (Signed)
[facility] OFFICE PROGRESS NOTE  Moding, Karle Starch, MD [address]  [place] [address]  DIAGNOSIS: Colon adenocarcinoma Stage III, (pT4b pN2a M0; 4 out of 15 lymph nodes were positive), moderately differentiated.     PAST THERAPY: S/p resection and 6 months adjuvant FOLFOX chemotherapy. (02/02/2010 through 07/22/2010).   CURRENT THERAPY: None   ONCOLOGY HISTORY: She underwent a colonoscopy by Dr. Teena Irani on 12/13/2009 with biopsy showing adenocarcinoma. Preoperative CEA was 73.7. 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 Armandina Gemma. The tumor focally involved the pancreas. 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 There was focal lymphovascular invasion, but no perineural invasion. All margins were negative. 4 out of 15 lymph nodes were positive. The patient received 6 months of adjuvant FOLFOX in combination with Neulasta from 02/02/2010 through 07/22/2010.   OTHER ISSUES: 1. She presented with iron-deficient anemia.  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 Follow up scan showed a 3.4X2.2 left upper pole mass in kidney, unchanged.  2. Multiple small liver lesions were was found on surveillance scan (CT and MRI) since Jan 2015. Liver biopsy was negative for malignancy   INTERVAL HISTORY: Diane Erickson 53 y.o. female with history of colon cancer. She was last seen by me on 12/22/13 by Dr. Juliann Mule, who has left the practice. She is here for follow-up alone.    She reports fatigue and mild dyspnea on exertion in the past few months, no other complains. She lost her [employer] but got it back a few months ago. Her last colonoscopy was 3 years ago. She was supposed to have a abd MRI with this visit but she did not schedule due to her insurance issue.   FH: Her brother had colon cancer  at age of 45's and her mother had lung cancer at age of 44's. No other family history of malignancy.   MEDICAL HISTORY: Past Medical History  Diagnosis Date  . Colon cancer     colon ca dx 8/11   INTERIM HISTORY: has Malignant neoplasm of colon; Hyperlipidemia; and Urinary tract infection on her problem list.    ALLERGIES:  has No Known Allergies.  MEDICATIONS: None   SURGICAL HISTORY:  Past Surgical History  Procedure Laterality Date  . Colon surgery      left colectomy, distal pancreatectomy, splenectomy  . Tubal ligation      1997   REVIEW OF SYSTEMS:   Constitutional: Denies fevers, chills or abnormal weight loss, (+) fatigue  Eyes: Denies blurriness of vision Ears, nose, mouth, throat, and face: Denies mucositis or sore throat Respiratory: Denies cough, wheezes, (+) dyspnea on exertion  Cardiovascular: Denies palpitation, chest discomfort or lower extremity swelling Gastrointestinal:  Denies nausea, heartburn or change in bowel habits Skin: Denies abnormal skin rashes Lymphatics: Denies new lymphadenopathy or easy bruising Neurological:Denies numbness, tingling or new weaknesses Behavioral/Psych: Mood is stable, no new changes  All other systems were reviewed with the patient and are negative.  PHYSICAL EXAMINATION: ECOG PERFORMANCE STATUS: 0 - Asymptomatic  Blood pressure 140/88, pulse 70, temperature 97.8 F (36.6 C), temperature source Oral, resp. rate 18, height 5\' 6"  (1.676 m), weight 149 lb 9.6 oz (67.858 kg), last menstrual period 04/21/2012, SpO2 100 %.  GENERAL:alert, no distress and comfortable; mildly overweight.  SKIN: skin color, texture, turgor are normal, no rashes or significant lesions; +  L portacath EYES: normal, Conjunctiva are pink and non-injected, sclera clear OROPHARYNX:no exudate, no erythema and lips, buccal mucosa, and tongue normal  NECK: supple, thyroid normal size, non-tender, without nodularity LYMPH:  no palpable lymphadenopathy in the  cervical, axillary or supraclavicular LUNGS: clear to auscultation and percussion with normal breathing effort HEART: regular rate & rhythm and no murmurs and no lower extremity edema ABDOMEN:abdomen soft, non-tender and normal bowel sounds; mildline scar well healed.  Musculoskeletal:no cyanosis of digits and no clubbing  NEURO: alert & oriented x 3 with fluent speech, no focal motor/sensory deficits  LABORATORY DATA: Results for orders placed or performed in visit on 03/27/14 (from the past 48 hour(s))  CBC with Differential     Status: Abnormal   Collection Time: 03/27/14  8:09 AM  Result Value Ref Range   WBC 6.7 3.9 - 10.3 10e3/uL   NEUT# 3.9 1.5 - 6.5 10e3/uL   HGB 12.9 11.6 - 15.9 g/dL   HCT 39.4 34.8 - 46.6 %   Platelets 323 145 - 400 10e3/uL   MCV 94.9 79.5 - 101.0 fL   MCH 31.1 25.1 - 34.0 pg   MCHC 32.7 31.5 - 36.0 g/dL   RBC 4.15 3.70 - 5.45 10e6/uL   RDW 14.9 (H) 11.2 - 14.5 %   lymph# 2.0 0.9 - 3.3 10e3/uL   MONO# 0.7 0.1 - 0.9 10e3/uL   Eosinophils Absolute 0.1 0.0 - 0.5 10e3/uL   Basophils Absolute 0.0 0.0 - 0.1 10e3/uL   NEUT% 58.6 38.4 - 76.8 %   LYMPH% 30.0 14.0 - 49.7 %   MONO% 10.0 0.0 - 14.0 %   EOS% 1.0 0.0 - 7.0 %   BASO% 0.4 0.0 - 2.0 %    Labs:  Lab Results  Component Value Date   WBC 6.7 03/27/2014   HGB 12.9 03/27/2014   HCT 39.4 03/27/2014   MCV 94.9 03/27/2014   PLT 323 03/27/2014   NEUTROABS 3.9 03/27/2014      Chemistry      Component Value Date/Time   NA 142 12/22/2013 0925   NA 139 11/22/2011 1114   NA 137 04/19/2011 1545   K 4.7 12/22/2013 0925   K 3.7 11/22/2011 1114   K 4.7 04/19/2011 1545   CL 105 09/02/2012 1029   CL 105 11/22/2011 1114   CL 103 04/19/2011 1545   CO2 23 12/22/2013 0925   CO2 26 11/22/2011 1114   CO2 30 04/19/2011 1545   BUN 11.0 12/22/2013 0925   BUN 11 11/22/2011 1114   BUN 16 04/19/2011 1545   CREATININE 0.7 12/22/2013 0925   CREATININE 0.53 11/22/2011 1114   CREATININE 0.6 04/19/2011 1545       Component Value Date/Time   CALCIUM 9.5 12/22/2013 0925   CALCIUM 9.2 11/22/2011 1114   CALCIUM 8.6 04/19/2011 1545   ALKPHOS 49 12/22/2013 0925   ALKPHOS 36* 11/22/2011 1114   ALKPHOS 68 04/19/2011 1545   AST 29 12/22/2013 0925   AST 17 11/22/2011 1114   AST 24 04/19/2011 1545   ALT 16 12/22/2013 0925   ALT 9 11/22/2011 1114   ALT 19 04/19/2011 1545   BILITOT 0.37 12/22/2013 0925   BILITOT 0.4 11/22/2011 1114   BILITOT 0.40 04/19/2011 1545     Results for Diane Erickson, Diane Erickson (MRN [id number]) as of 06/15/2013 09:55  Ref. Range 05/20/2012 14:53 09/02/2012 10:29 12/30/2012 13:31 05/05/2013 08:52  CEA Latest Range: 0.0-5.0 ng/mL 3.4 3.2 2.9 2.9   Today's CEA is still pending  RADIOGRAPHIC STUDIES: 08/28/2013 Colon cancer diagnosed 8/11. Chemotherapy complete.  EXAM: CT ABDOMEN WITH CONTRAST TECHNIQUE:  Multidetector CT imaging of the abdomen was performed using the standard protocol following bolus administration of intravenous contrast. CONTRAST: 168mL OMNIPAQUE IOHEXOL 300 MG/ML SOLN COMPARISON: US BIOPSY dated 06/05/2013; MR ABDOMEN WO/W CM dated  05/29/2013; CT ABD/PELVIS W CM dated 05/19/2013; CT ABD/PELVIS W CM dated 05/20/2012; CT ABD/PELVIS W CM dated 08/17/2010; CT ABD/PELVIS W CM dated 12/15/2009 FINDINGS: Lower Chest: Mild scarring at the left lung base. Heart size upper normal, without pericardial or pleural effusion. Abdomen: Mild hepatic steatosis. Hyperenhancing hepatic focus adjacent the gallbladder measures 1.2 cm on image 21, similar. A smaller hyperenhancing focus in the right lobe on image 22 is also felt to be similar. No new liver lesions. Splenectomy. Normal stomach, pancreas, gallbladder, biliary tract, adrenal glands. An upper pole right renal cyst. Upper pole left renal mass. 3.4 x 2.2 cm on image 11, of series 5, unchanged. At least partially duplicated right renal collecting system. Too small to characterize interpolar left renal lesion. Aortic atherosclerosis. No  retroperitoneal or retrocrural adenopathy. Normal abdominal bowel loops, without ascites. No evidence of omental or peritoneal disease. Bones/Musculoskeletal: No acute osseous abnormality. IMPRESSION: 1. Similar hyperenhancing foci within the right lobe of the liver. These were better evaluated on 05/29/2013 MRI. 2. Otherwise, no evidence of metastatic disease within the abdomen. 3. Upper pole left renal mass, similar. This remains suspicious for a relatively indolent neoplasm.   PATHOLOGY:  06/05/2013 Diagnosis Liver, needle/core biopsy - BENIGN LIVER.- NO EVIDENCE OF MALIGNANCY, SEE COMMENT.Diagnosis Note  There is no evidence of malignancy and the biopsy is likely not representative of the lesion in question. These findings correlate with those of the liver fine needle aspiration ([id number]).Vicente Males MD Pathologist, Electronic Signature (Case signed 06/06/2013)  ASSESSMENT: Diane Erickson 52 y.o. female with a history of stage III colon cancer.  She was found to have multiple (at least 4) small hyperenhancing liver lesions on MRI in 05/2013, and liver biopsy was negative for malignancy. She also has a stable left kidney mass, measuring about 3.6 cm.  PLAN:  -I recommend to repeat abdominal MRI to follow-up her liver lesions and left kidney mass. If the liver lesion got bigger, or her CEA level significantly increase, I would recommend second liver biopsy. I will discuss with our radiologist about her kidney mass, if there is suspicion for malignancy on the skin, I'll refer her to urology clinic. -Due to her significant family history of colon cancer and a young age of onset, I refer her to genetic counseling. She agrees and we will schedule her appointment.  She will return in 3 months with a repeated CBC, CMP, CEA, and a physical exam.  I'll call her back with her MI result in a further decision about liver biopsy and to urology referral.  All questions were answered. The patient knows to call  the clinic with any problems, questions or concerns. We can certainly see the patient much sooner if necessary.  I spent 20 minutes counseling the patient face to face. The total time spent in the appointment was 30 minutes.    Truitt Merle, MD 03/27/2014 8:32 AM

## 2014-04-03 ENCOUNTER — Other Ambulatory Visit: Payer: Self-pay

## 2014-04-03 ENCOUNTER — Ambulatory Visit: Payer: Self-pay

## 2014-04-07 DIAGNOSIS — N2889 Other specified disorders of kidney and ureter: Secondary | ICD-10-CM

## 2014-04-07 HISTORY — DX: Other specified disorders of kidney and ureter: N28.89

## 2014-04-10 ENCOUNTER — Ambulatory Visit (HOSPITAL_COMMUNITY)
Admission: RE | Admit: 2014-04-10 | Discharge: 2014-04-10 | Disposition: A | Discharge status: Home / Self Care | Payer: Medicaid Other | Source: Ambulatory Visit | Attending: Hematology | Admitting: Hematology

## 2014-04-10 DIAGNOSIS — N281 Cyst of kidney, acquired: Secondary | ICD-10-CM | POA: Diagnosis not present

## 2014-04-10 DIAGNOSIS — N289 Disorder of kidney and ureter, unspecified: Secondary | ICD-10-CM | POA: Diagnosis present

## 2014-04-10 DIAGNOSIS — C189 Malignant neoplasm of colon, unspecified: Secondary | ICD-10-CM | POA: Insufficient documentation

## 2014-04-10 DIAGNOSIS — K769 Liver disease, unspecified: Secondary | ICD-10-CM | POA: Insufficient documentation

## 2014-04-10 DIAGNOSIS — Z85038 Personal history of other malignant neoplasm of large intestine: Secondary | ICD-10-CM

## 2014-04-10 MED ORDER — GADOBENATE DIMEGLUMINE 529 MG/ML IV SOLN
15.0000 mL | Freq: Once | INTRAVENOUS | Status: AC | PRN
  Administered 2014-04-10: 14 mL via INTRAVENOUS

## 2014-04-24 ENCOUNTER — Ambulatory Visit (HOSPITAL_BASED_OUTPATIENT_CLINIC_OR_DEPARTMENT_OTHER): Payer: Medicaid Other

## 2014-04-24 VITALS — BP 148/98 | HR 77

## 2014-04-24 DIAGNOSIS — C185 Malignant neoplasm of splenic flexure: Secondary | ICD-10-CM

## 2014-04-24 DIAGNOSIS — Z95828 Presence of other vascular implants and grafts: Secondary | ICD-10-CM

## 2014-04-24 DIAGNOSIS — Z452 Encounter for adjustment and management of vascular access device: Secondary | ICD-10-CM

## 2014-04-24 MED ORDER — SODIUM CHLORIDE 0.9 % IJ SOLN
10.0000 mL | INTRAMUSCULAR | Status: DC | PRN
  Administered 2014-04-24: 10 mL via INTRAVENOUS
  Filled 2014-04-24: qty 10

## 2014-04-24 MED ORDER — HEPARIN SOD (PORK) LOCK FLUSH 100 UNIT/ML IV SOLN
500.0000 [IU] | Freq: Once | INTRAVENOUS | Status: AC
  Administered 2014-04-24: 500 [IU] via INTRAVENOUS
  Filled 2014-04-24: qty 5

## 2014-04-24 NOTE — Patient Instructions (Signed)

## 2014-06-18 ENCOUNTER — Other Ambulatory Visit: Payer: Self-pay

## 2014-06-18 DIAGNOSIS — Z1231 Encounter for screening mammogram for malignant neoplasm of breast: Secondary | ICD-10-CM

## 2014-06-26 ENCOUNTER — Other Ambulatory Visit: Payer: Self-pay | Admitting: *Deleted

## 2014-06-26 ENCOUNTER — Ambulatory Visit
Admission: RE | Admit: 2014-06-26 | Discharge: 2014-06-26 | Disposition: A | Discharge status: Home / Self Care | Payer: Medicaid Other | Source: Ambulatory Visit

## 2014-06-26 ENCOUNTER — Telehealth: Payer: Self-pay | Admitting: *Deleted

## 2014-06-26 DIAGNOSIS — C189 Malignant neoplasm of colon, unspecified: Secondary | ICD-10-CM

## 2014-06-26 DIAGNOSIS — Z1231 Encounter for screening mammogram for malignant neoplasm of breast: Secondary | ICD-10-CM

## 2014-06-26 MED ORDER — LIDOCAINE-PRILOCAINE 2.5-2.5 % EX CREA: 1.0000 "application " | TOPICAL_CREAM | CUTANEOUS | Status: DC | PRN

## 2014-06-26 NOTE — Telephone Encounter (Signed)
CALLED PT NO ANSWER

## 2014-06-26 NOTE — Telephone Encounter (Signed)
CALL RECEIVED FROM PATIENT REQUESTING REFILL ON EML[employer] [facility]AM FOR MONDAY.  CONFIRMED PHARMACY, REFILL SENT AND INSTRUCTIONS GIVEN TO CALL PHARMACY FOR FUTURE REFILLS.

## 2014-06-26 NOTE — Telephone Encounter (Signed)
WILL CALL PT. AGAIN LATER TODAY.

## 2014-06-29 ENCOUNTER — Other Ambulatory Visit (HOSPITAL_BASED_OUTPATIENT_CLINIC_OR_DEPARTMENT_OTHER): Payer: Medicaid Other

## 2014-06-29 DIAGNOSIS — Z85038 Personal history of other malignant neoplasm of large intestine: Secondary | ICD-10-CM

## 2014-06-29 LAB — COMPREHENSIVE METABOLIC PANEL (CC13)
ALK PHOS: 46 U/L (ref 40–150)
ALT: 7 U/L (ref 0–55)
AST: 19 U/L (ref 5–34)
Albumin: 4.3 g/dL (ref 3.5–5.0)
Anion Gap: 12 mEq/L — ABNORMAL HIGH (ref 3–11)
BUN: 13.7 mg/dL (ref 7.0–26.0)
CALCIUM: 9.9 mg/dL (ref 8.4–10.4)
CO2: 24 mEq/L (ref 22–29)
Chloride: 107 mEq/L (ref 98–109)
Creatinine: 0.7 mg/dL (ref 0.6–1.1)
EGFR: >90 mL/min/{1.73_m2} (ref >90)
Glucose: 76 mg/dl (ref 70–140)
POTASSIUM: 4.3 meq/L (ref 3.5–5.1)
SODIUM: 143 meq/L (ref 136–145)
Total Bilirubin: 0.77 mg/dL (ref 0.20–1.20)
Total Protein: 7.7 g/dL (ref 6.4–8.3)

## 2014-06-29 LAB — CBC WITH DIFFERENTIAL/PLATELET
BASO%: 0.3 % (ref 0.0–2.0)
Basophils Absolute: 0 10*3/uL (ref 0.0–0.1)
EOS%: 1.3 % (ref 0.0–7.0)
Eosinophils Absolute: 0.1 10*3/uL (ref 0.0–0.5)
HCT: 40.5 % (ref 34.8–46.6)
HGB: 13.7 g/dL (ref 11.6–15.9)
LYMPH%: 30.9 % (ref 14.0–49.7)
MCH: 31.6 pg (ref 25.1–34.0)
MCHC: 33.8 g/dL (ref 31.5–36.0)
MCV: 93.5 fL (ref 79.5–101.0)
MONO#: 0.5 10*3/uL (ref 0.1–0.9)
MONO%: 8.7 % (ref 0.0–14.0)
NEUT#: 3.5 10*3/uL (ref 1.5–6.5)
NEUT%: 58.8 % (ref 38.4–76.8)
Platelets: 247 10*3/uL (ref 145–400)
RBC: 4.33 10*6/uL (ref 3.70–5.45)
RDW: 13.7 % (ref 11.2–14.5)
WBC: 6 10*3/uL (ref 3.9–10.3)
lymph#: 1.8 10*3/uL (ref 0.9–3.3)

## 2014-06-29 LAB — CEA: CEA: 2.8 ng/mL (ref 0.0–5.0)

## 2014-06-30 ENCOUNTER — Telehealth: Payer: Self-pay | Admitting: Internal Medicine

## 2014-06-30 NOTE — Telephone Encounter (Signed)
Call to patient to confirm appointment for 07/01/14 at 8:15 no answer

## 2014-07-01 ENCOUNTER — Ambulatory Visit (INDEPENDENT_AMBULATORY_CARE_PROVIDER_SITE_OTHER): Payer: Medicaid Other | Admitting: Internal Medicine

## 2014-07-01 ENCOUNTER — Other Ambulatory Visit (HOSPITAL_COMMUNITY)
Admission: RE | Admit: 2014-07-01 | Discharge: 2014-07-01 | Disposition: A | Discharge status: Home / Self Care | Payer: Medicaid Other | Source: Ambulatory Visit | Attending: Internal Medicine | Admitting: Internal Medicine

## 2014-07-01 ENCOUNTER — Encounter: Payer: Self-pay | Admitting: Internal Medicine

## 2014-07-01 VITALS — BP 144/87 | HR 71 | Temp 98.0°F | Ht 66.0 in | Wt 148.6 lb

## 2014-07-01 DIAGNOSIS — Z124 Encounter for screening for malignant neoplasm of cervix: Secondary | ICD-10-CM | POA: Insufficient documentation

## 2014-07-01 DIAGNOSIS — Z01419 Encounter for gynecological examination (general) (routine) without abnormal findings: Secondary | ICD-10-CM | POA: Insufficient documentation

## 2014-07-01 NOTE — Progress Notes (Signed)
   Subjective:   Patient ID: Diane Erickson female   DOB: July 29, 1960 54 y.o.   MRN: [id number]  HPI: [name] is a 54 y.o. woman pmh as listed below presents for pap smear.   Pt states that she is interested in a pap smear today. She is not having any symptoms such as vaginal discharge, dyspareunia, or abdominal pain. She has no new sexual contacts. It was explained that the patient is not due for a pap smear per new guidelines but she is insistent. Denies any other want for STI testing.    Past Medical History  Diagnosis Date  . Colon cancer     colon ca dx 8/11   Current Outpatient Prescriptions  Medication Sig Dispense Refill  . lidocaine-prilocaine (EMLA) cream Apply 1 application topically as needed. Apply to port site one hour before tx and cover with plastic wrap. 30 g 2   No current facility-administered medications for this visit.   Family History  Problem Relation Age of Onset  . Cancer Mother     lung cancer  . Colon cancer Neg Hx   . Cancer Brother 42   History   Social History  . Marital Status: Single    Spouse Name: N/A  . Number of Children: N/A  . Years of Education: N/A   Occupational History  .      works at [employer] as a Programme researcher, broadcasting/film/video in [place]  . Smoking status: Former Research scientist (life sciences)  . Smokeless tobacco: Never Used  . Alcohol Use: 0.0 oz/week    0 Standard drinks or equivalent per week     Comment: occasional alcohol, wine  . Drug Use: No  . Sexual Activity: Not on file   Other Topics Concern  . None   Social History Narrative   Patient lives alone and with her 2 children( 52 years and 21 years old). Sexually active iwthon epartner, does not use condom.   Review of Systems: Pertinent items are noted in HPI. Objective:  Physical Exam: Filed Vitals:   07/01/14 0822  BP: 144/87  Pulse: 71  Temp: 98 F (36.7 C)  TempSrc: Oral  Height: 5\' 6"  (1.676 m)  Weight: 148 lb 9.6 oz (67.405 kg)  SpO2: 100%     General: sitting in chair, NAD  Female genitalia: normal external genitalia, vulva, vagina, cervix, uterus and adnexa Abd: non-tender, non-distended, no hepatosplenomegaly,   Assessment & Plan:  Please see problem oriented charting  Pt discussed with Dr. Dareen Piano

## 2014-07-01 NOTE — Assessment & Plan Note (Signed)
Pt states that she would like a pap smear. It was explained given her normal paps in the past and new guidelines suggest that she wait 3 years before repeating unless circumstances have changed or the patient is having symptoms. Pt doesn't want to provide more information and insists on pap smear.  -results will be given to patient when done

## 2014-07-01 NOTE — Patient Instructions (Signed)
General Instructions:   Please try to bring all your medicines next time. This will help [place] keep you safe from mistakes.   Progress Toward Treatment Goals:  No flowsheet data found.  Self Care Goals & Plans:  No flowsheet data found.  No flowsheet data found.   Care Management & Community Referrals:  No flowsheet data found.

## 2014-07-02 ENCOUNTER — Telehealth: Payer: Self-pay | Admitting: Hematology

## 2014-07-02 ENCOUNTER — Encounter: Payer: Self-pay | Admitting: Hematology

## 2014-07-02 ENCOUNTER — Ambulatory Visit (HOSPITAL_BASED_OUTPATIENT_CLINIC_OR_DEPARTMENT_OTHER): Payer: Medicaid Other | Admitting: Hematology

## 2014-07-02 VITALS — BP 145/90 | HR 67 | Temp 98.1°F | Resp 18 | Ht 66.0 in | Wt 150.2 lb

## 2014-07-02 DIAGNOSIS — R932 Abnormal findings on diagnostic imaging of liver and biliary tract: Secondary | ICD-10-CM

## 2014-07-02 DIAGNOSIS — Z85038 Personal history of other malignant neoplasm of large intestine: Secondary | ICD-10-CM

## 2014-07-02 DIAGNOSIS — N2889 Other specified disorders of kidney and ureter: Secondary | ICD-10-CM

## 2014-07-02 DIAGNOSIS — C189 Malignant neoplasm of colon, unspecified: Secondary | ICD-10-CM

## 2014-07-02 NOTE — Progress Notes (Signed)
[facility] OFFICE PROGRESS NOTE  Moding, Karle Starch, MD [address] 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     PAST THERAPY: S/p resection and 6 months adjuvant FOLFOX chemotherapy. (02/02/2010 through 07/22/2010).   CURRENT THERAPY: None   ONCOLOGY HISTORY: She underwent a colonoscopy by Dr. Teena Irani on 12/13/2009 with biopsy showing adenocarcinoma. Preoperative CEA was 73.7. 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 Armandina Gemma. The tumor focally involved the pancreas. 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 There was focal lymphovascular invasion, but no perineural invasion. All margins were negative. 4 out of 15 lymph nodes were positive. The patient received 6 months of adjuvant FOLFOX in combination with Neulasta from 02/02/2010 through 07/22/2010.   OTHER ISSUES: 1. She presented with iron-deficient anemia.  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 Follow up scan showed a 3.4X2.2 left upper pole mass in kidney, unchanged.  2. Multiple small liver lesions were was found on surveillance scan (CT and MRI) since Jan 2015. Liver biopsy was negative for malignancy   INTERVAL HISTORY: Diane Erickson 54 y.o. female with history of colon cancer. She was last seen by me on 12/22/13 by Dr. Juliann Mule, who has left the practice. She is here for follow-up alone.    She is doing well overall, appetite fluctuates, which is a chronic issue, but weight is stable, she had mammogram last week which was normal, she is scheduled to have a repeated colonoscopy in a few weeks. She denies any abdominal discomfort, nausea, change of her bowel habits.  FH: Her brother had colon cancer at age of 21's and  her mother had lung cancer at age of 33's. No other family history of malignancy.   MEDICAL HISTORY: Past Medical History  Diagnosis Date  . Colon cancer     colon ca dx 8/11   INTERIM HISTORY: has Malignant neoplasm of colon; Hyperlipidemia; Urinary tract infection; History of colon cancer; and Pap smear for cervical cancer screening on her problem list.    ALLERGIES:  has No Known Allergies.  MEDICATIONS: None   SURGICAL HISTORY:  Past Surgical History  Procedure Laterality Date  . Colon surgery      left colectomy, distal pancreatectomy, splenectomy  . Tubal ligation      1997   REVIEW OF SYSTEMS:   Constitutional: Denies fevers, chills or abnormal weight loss, (+) fatigue  Eyes: Denies blurriness of vision Ears, nose, mouth, throat, and face: Denies mucositis or sore throat Respiratory: Denies cough, wheezes, (+) dyspnea on exertion  Cardiovascular: Denies palpitation, chest discomfort or lower extremity swelling Gastrointestinal:  Denies nausea, heartburn or change in bowel habits Skin: Denies abnormal skin rashes Lymphatics: Denies new lymphadenopathy or easy bruising Neurological:Denies numbness, tingling or new weaknesses Behavioral/Psych: Mood is stable, no new changes  All other systems were reviewed with the patient and are negative.  PHYSICAL EXAMINATION: ECOG PERFORMANCE STATUS: 0 - Asymptomatic  Blood pressure 145/90, pulse 67, temperature 98.1 F (36.7 C), temperature source Oral, resp. rate 18, height $RemoveBe'5\' 6"'AMndnYfqc$  (1.676 m), weight 150 lb 3.2 oz (68.13 kg), last menstrual period 04/21/2012, SpO2 100 %.  GENERAL:alert, no distress and comfortable; mildly overweight.  SKIN: skin color, texture, turgor are normal, no rashes or significant lesions; + L  portacath EYES: normal, Conjunctiva are pink and non-injected, sclera clear OROPHARYNX:no exudate, no erythema and lips, buccal mucosa, and tongue normal  NECK: supple, thyroid normal size, non-tender, without  nodularity LYMPH:  no palpable lymphadenopathy in the cervical, axillary or supraclavicular LUNGS: clear to auscultation and percussion with normal breathing effort HEART: regular rate & rhythm and no murmurs and no lower extremity edema ABDOMEN:abdomen soft, non-tender and normal bowel sounds; mildline scar well healed.  Musculoskeletal:no cyanosis of digits and no clubbing  NEURO: alert & oriented x 3 with fluent speech, no focal motor/sensory deficits  LABORATORY DATA: No results found for this or any previous visit (from the past 48 hour(s)).  Labs:  Lab Results  Component Value Date   WBC 6.0 06/29/2014   HGB 13.7 06/29/2014   HCT 40.5 06/29/2014   MCV 93.5 06/29/2014   PLT 247 06/29/2014   NEUTROABS 3.5 06/29/2014      Chemistry      Component Value Date/Time   NA 143 06/29/2014 0859   NA 139 11/22/2011 1114   NA 137 04/19/2011 1545   K 4.3 06/29/2014 0859   K 3.7 11/22/2011 1114   K 4.7 04/19/2011 1545   CL 105 09/02/2012 1029   CL 105 11/22/2011 1114   CL 103 04/19/2011 1545   CO2 24 06/29/2014 0859   CO2 26 11/22/2011 1114   CO2 30 04/19/2011 1545   BUN 13.7 06/29/2014 0859   BUN 11 11/22/2011 1114   BUN 16 04/19/2011 1545   CREATININE 0.7 06/29/2014 0859   CREATININE 0.53 11/22/2011 1114   CREATININE 0.6 04/19/2011 1545      Component Value Date/Time   CALCIUM 9.9 06/29/2014 0859   CALCIUM 9.2 11/22/2011 1114   CALCIUM 8.6 04/19/2011 1545   ALKPHOS 46 06/29/2014 0859   ALKPHOS 36* 11/22/2011 1114   ALKPHOS 68 04/19/2011 1545   AST 19 06/29/2014 0859   AST 17 11/22/2011 1114   AST 24 04/19/2011 1545   ALT 7 06/29/2014 0859   ALT 9 11/22/2011 1114   ALT 19 04/19/2011 1545   BILITOT 0.77 06/29/2014 0859   BILITOT 0.4 11/22/2011 1114   BILITOT 0.40 04/19/2011 1545     CEA (Order [id number])      CEA  Status: Finalresult Visible to patient:  Not Released Nextappt: None Dx:  History of colon cancer           Ref Range 3d  ago  31mo ago  25mo ago  77mo ago     CEA 0.0 - 5.0 ng/mL 2.8 2.8 3.3 2.7          RADIOGRAPHIC STUDIES: 08/28/2013 Colon cancer diagnosed 8/11. Chemotherapy complete.  EXAM: CT ABDOMEN WITH CONTRAST TECHNIQUE:  Multidetector CT imaging of the abdomen was performed using the standard protocol following bolus administration of intravenous contrast. CONTRAST: 133mL OMNIPAQUE IOHEXOL 300 MG/ML SOLN COMPARISON: US BIOPSY dated 06/05/2013; MR ABDOMEN WO/W CM dated  05/29/2013; CT ABD/PELVIS W CM dated 05/19/2013; CT ABD/PELVIS W CM dated 05/20/2012; CT ABD/PELVIS W CM dated 08/17/2010; CT ABD/PELVIS W CM dated 12/15/2009 FINDINGS: Lower Chest: Mild scarring at the left lung base. Heart size upper normal, without pericardial or pleural effusion. Abdomen: Mild hepatic steatosis. Hyperenhancing hepatic focus adjacent the gallbladder measures 1.2 cm on image 21, similar. A smaller hyperenhancing focus in the right lobe on image 22 is also felt to be similar. No new liver lesions. Splenectomy. Normal stomach, pancreas, gallbladder, biliary tract, adrenal glands. An upper pole right renal cyst.  Upper pole left renal mass. 3.4 x 2.2 cm on image 11, of series 5, unchanged. At least partially duplicated right renal collecting system. Too small to characterize interpolar left renal lesion. Aortic atherosclerosis. No retroperitoneal or retrocrural adenopathy. Normal abdominal bowel loops, without ascites. No evidence of omental or peritoneal disease. Bones/Musculoskeletal: No acute osseous abnormality. IMPRESSION: 1. Similar hyperenhancing foci within the right lobe of the liver. These were better evaluated on 05/29/2013 MRI. 2. Otherwise, no evidence of metastatic disease within the abdomen. 3. Upper pole left renal mass, similar. This remains suspicious for a relatively indolent neoplasm.   MRI abdomen 04/10/2014  IMPRESSION: Stable multifocal hypervascular liver lesions, largest measuring 1.7 cm, most consistent with  benign etiology such as focal nodular hyperplasia. No definite evidence of liver metastases. Continued followup by MRI recommended.  Stable 3.6 cm solid enhancing mass in the upper pole the left kidney, consistent with low-grade renal cell carcinoma.  No new or progressive disease identified within the abdomen.   PATHOLOGY:  06/05/2013 Diagnosis Liver, needle/core biopsy - BENIGN LIVER.- NO EVIDENCE OF MALIGNANCY, SEE COMMENT.Diagnosis Note  There is no evidence of malignancy and the biopsy is likely not representative of the lesion in question. These findings correlate with those of the liver fine needle aspiration ([id number]).Vicente Males MD Pathologist, Electronic Signature (Case signed 06/06/2013)  ASSESSMENT: Diane Erickson 54 y.o. female with a history of stage III colon cancer.  She was found to have multiple (at least 4) small hyperenhancing liver lesions on MRI in 05/2013, and liver biopsy was negative for malignancy. She also has a stable left kidney mass, measuring about 3.6 cm.  1. Colon cancer stage III, MSI unknown -She is clinically doing very well, no clinical evidence of disease recurrence. It has been almost 4 years since she completed her adjuvant chemotherapy. -We will continue her surveillance for an additional year. Due to her liver lesions, I'll see her back in 6 months with a repeat his CT scan. -Due to her significant family history of colon cancer and a young age of onset, I refer her to genetic counseling. She agrees and we will schedule her appointment.  2. Liver lesions, likely benign biopsy negative in earlier 2015. -Her follow-up abdominal MRI showed stable multifocal hypervascular lesions, likely benign etiology. One liver lesion was biopsied in earlier 2015, which was negative.  We'll continue following.   3. 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  Plan: -Genetic and urology  referral -She will return in 6 months with a repeated CBC, CMP, CEA, and a CT of abdomen and pelvis.    All questions were answered. The patient knows to call the clinic with any problems, questions or concerns. We can certainly see the patient much sooner if necessary.  I spent 20 minutes counseling the patient face to face. The total time spent in the appointment was 30 minutes.    Truitt Merle, MD 07/02/2014 9:36 AM

## 2014-07-02 NOTE — Telephone Encounter (Signed)
no vm....mailed pt appt sched/avs and letter .Marland KitchenMarland KitchenMarland KitchenMarland KitchenPt sched for [place] March 21 9:15

## 2014-07-03 ENCOUNTER — Telehealth: Payer: Self-pay | Admitting: Hematology

## 2014-07-03 LAB — CYTOLOGY - PAP

## 2014-07-03 NOTE — Telephone Encounter (Signed)
Faxed pt medical records to [phone]

## 2014-07-03 NOTE — Progress Notes (Signed)
INTERNAL MEDICINE TEACHING ATTENDING ADDENDUM - Diane Dettore, MD: I reviewed and discussed at the time of visit with the resident Dr. Sadek, the patient's medical history, physical examination, diagnosis and results of pertinent tests and treatment and I agree with the patient's care as documented.  

## 2014-07-27 ENCOUNTER — Encounter: Payer: Medicaid Other | Admitting: Genetic Counselor

## 2014-07-27 ENCOUNTER — Ambulatory Visit (HOSPITAL_BASED_OUTPATIENT_CLINIC_OR_DEPARTMENT_OTHER): Payer: Medicaid Other | Admitting: Genetic Counselor

## 2014-07-27 ENCOUNTER — Encounter: Payer: Self-pay | Admitting: Genetic Counselor

## 2014-07-27 ENCOUNTER — Other Ambulatory Visit: Payer: Medicaid Other

## 2014-07-27 DIAGNOSIS — Z315 Encounter for genetic counseling: Secondary | ICD-10-CM

## 2014-07-27 DIAGNOSIS — Z85038 Personal history of other malignant neoplasm of large intestine: Secondary | ICD-10-CM

## 2014-07-27 DIAGNOSIS — C189 Malignant neoplasm of colon, unspecified: Secondary | ICD-10-CM

## 2014-07-27 DIAGNOSIS — Z800 Family history of malignant neoplasm of digestive organs: Secondary | ICD-10-CM

## 2014-07-27 DIAGNOSIS — Z803 Family history of malignant neoplasm of breast: Secondary | ICD-10-CM | POA: Insufficient documentation

## 2014-07-27 NOTE — Progress Notes (Signed)
REFERRING PROVIDER: Charlesetta Shanks, MD [place], [place] [address]   Truitt Merle, MD  PRIMARY PROVIDER:  Arman Filter, MD  PRIMARY REASON FOR VISIT:  1. Malignant neoplasm of colon   2. Family history of colon cancer   3. Family history of breast cancer      HISTORY OF PRESENT ILLNESS:   Diane Erickson, a 54 y.o. female, was seen for a [facility] cancer genetics consultation at the request of Dr. Trudee Kuster due to a personal and family history of cancer.  Diane Erickson presents to clinic today to discuss the possibility of a hereditary predisposition to cancer, genetic testing, and to further clarify her future cancer risks, as well as potential cancer risks for family members.   In 2011, at the age of 76, Diane Erickson was diagnosed with cancer of the transverse colon. This was treated with surgery and chemotherapy.  At that time, approximately 5 colon polyps were found.  She has had colonoscopies since this diagnosis and all have been clear.   CANCER HISTORY:   No history exists.     HORMONAL RISK FACTORS:  Ovaries intact: yes.  Hysterectomy: no.  Colonoscopy: yes; in 2011 five polyps were found, but she has not had polyps since.. Mammogram within the last year: yes. Number of breast biopsies: 0. Up to date with pelvic exams:  yes. Any excessive radiation exposure in the past:  no  Past Medical History  Diagnosis Date  . Colon cancer     colon ca dx 8/11  . Family history of colon cancer   . Family history of breast cancer     Past Surgical History  Procedure Laterality Date  . Colon surgery      left colectomy, distal pancreatectomy, splenectomy  . Tubal ligation      1997    History   Social History  . Marital Status: Single    Spouse Name: N/A  . Number of Children: 2  . Years of Education: N/A   Occupational History  .      works at [employer] as a Programme researcher, broadcasting/film/video in [place]  . Smoking status: Former Research scientist (life sciences)  .  Smokeless tobacco: Never Used  . Alcohol Use: 0.0 oz/week    0 Erickson drinks or equivalent per week     Comment: occasional alcohol, wine  . Drug Use: No  . Sexual Activity: Not on file   Other Topics Concern  . None   Social History Narrative   Patient lives alone and with her 2 children( 54 years and 41 years old). Sexually active iwthon epartner, does not use condom.     FAMILY HISTORY:  We obtained a detailed, 4-generation family history.  Significant diagnoses are listed below: Family History  Problem Relation Age of Onset  . Lung cancer Mother 71  . Colon cancer Brother     dx in his 29s  . Diabetes Father   . Diabetes Maternal Grandmother   . Breast cancer Cousin 26   Diane Erickson has one brother who had colon cancer in his 41s.  Her mother died of lung cancer and her father died of complications from diabetes.  Her mother had one brother and one sister, neither had cancer.  However, Diane Erickson's uncle had a daughter who was recently diagnosed with breast cancer at age 55.  There is no other cancer reported on either side of the family. Patient's maternal ancestors are of African  American descent, and paternal ancestors are of African American descent. There is no reported Ashkenazi Jewish ancestry. There is no known consanguinity.  GENETIC COUNSELING ASSESSMENT: Diane Erickson is a 54 y.o. female with a personal and family history of colon cancer which somewhat suggestive of a hereditary cancer syndrome and predisposition to cancer. We, therefore, discussed and recommended the following at today's visit.   DISCUSSION: We reviewed the characteristics, features and inheritance patterns of hereditary cancer syndromes. Based on Diane Erickson's early diagnosis of colon cancer we reviewed several hereditayr cancer syndromes including Lynch syndrome, FAP and MUTYH associated polyposis.  We briefly discussed CHEK2 with the family history of breast cancer as well.  We also discussed genetic  testing, including the appropriate family members to test, the process of testing, insurance coverage and turn-around-time for results. We discussed the implications of a negative, positive and/or variant of uncertain significant result. We recommended Diane Erickson pursue genetic testing for the Colorectal cancer gene panel. The Colorectal Cancer Panel offered by [facility] includes sequencing and/or duplication/deletion testing of the following 18 genes: APC, ATM, AXIN2, BMPR1A, CDH1, CHEK2, EPCAM, MLH1, MSH2, MSH6, MUTYH, PMS2, POLD1, POLE, PTEN, SCG5/GREM1, SMAD4, STK11, TP53, and XRCC2.    PLAN: After considering the risks, benefits, and limitations, Diane Erickson  provided informed consent to pursue genetic testing and the blood sample was sent to [facility] for analysis of the Colorectal Cancer gene panel. Results should be available within approximately 2-3 weeks' time, at which point they will be disclosed by telephone to Diane Erickson, as will any additional recommendations warranted by these results. Diane Erickson will receive a summary of her genetic counseling visit and a copy of her results once available. This information will also be available in Epic. We encouraged Diane Erickson to remain in contact with cancer genetics annually so that we can continuously update the family history and inform her of any changes in cancer genetics and testing that may be of benefit for her family. Diane Erickson questions were answered to her satisfaction today. Our contact information was provided should additional questions or concerns arise.  Lastly, we encouraged Diane Erickson to remain in contact with cancer genetics annually so that we can continuously update the family history and inform her of any changes in cancer genetics and testing that may be of benefit for this family.   Ms.  Hightower questions were answered to her satisfaction today. Our contact information was provided should additional questions or concerns  arise. Thank you for the referral and allowing [place] to share in the care of your patient.   Diane Erickson P. Florene Glen, [place], [facility] Certified Genetic Counselor Diane [email or web address] phone: [phone]  The patient was seen for a total of 45 minutes in face-to-face genetic counseling.  This patient was discussed with Drs. Magrinat, Lindi Adie and/or Burr Medico who agrees with the above.    _______________________________________________________________________ For Office Staff:  Number of people involved in session: 1 Was an Intern/ student involved with case: yes

## 2014-08-18 ENCOUNTER — Encounter: Payer: Self-pay | Admitting: Genetic Counselor

## 2014-08-18 DIAGNOSIS — Z1379 Encounter for other screening for genetic and chromosomal anomalies: Secondary | ICD-10-CM | POA: Insufficient documentation

## 2014-09-03 ENCOUNTER — Telehealth: Payer: Self-pay | Admitting: Genetic Counselor

## 2014-09-03 ENCOUNTER — Encounter: Payer: Self-pay | Admitting: Genetic Counselor

## 2014-09-03 DIAGNOSIS — Z1379 Encounter for other screening for genetic and chromosomal anomalies: Secondary | ICD-10-CM

## 2014-09-03 DIAGNOSIS — Z800 Family history of malignant neoplasm of digestive organs: Secondary | ICD-10-CM

## 2014-09-03 DIAGNOSIS — C189 Malignant neoplasm of colon, unspecified: Secondary | ICD-10-CM

## 2014-09-03 DIAGNOSIS — Z803 Family history of malignant neoplasm of breast: Secondary | ICD-10-CM

## 2014-09-03 NOTE — Progress Notes (Signed)
HPI: [name] was previously seen in the [place] clinic due to a personal and family history of cancer and concerns regarding a hereditary predisposition to cancer. Please refer to our prior cancer genetics clinic note for more information regarding [name]'s medical, social and family histories, and our assessment and recommendations, at the time. [name] recent genetic test results were disclosed to her, as were recommendations warranted by these results. These results and recommendations are discussed in more detail below.  GENETIC TEST RESULTS: At the time of [name]'s visit, we recommended she pursue genetic testing of the colorectal cancer gene panel. The Colorectal Cancer Panel offered by [facility] includes sequencing and/or duplication/deletion testing of the following 19 genes: APC, ATM, AXIN2, BMPR1A, CDH1, CHEK2, EPCAM, MLH1, MSH2, MSH6, MUTYH, PMS2, POLD1, POLE, PTEN, SCG5/GREM1, SMAD4, STK11, and TP53.  The report date is August 17, 2014.  Genetic testing was normal, and did not reveal a deleterious mutation in these genes. The test report has been scanned into EPIC and is located under the Media tab.   We discussed with [name] that since the current genetic testing is not perfect, it is possible there may be a gene mutation in one of these genes that current testing cannot detect, but that chance is small. We also discussed, that it is possible that another gene that has not yet been discovered, or that we have not yet tested, is responsible for the cancer diagnoses in the family, and it is, therefore, important to remain in touch with cancer genetics in the future so that we can continue to offer [name] the most up to date genetic testing.   ADDITIONAL GENETIC TESTING: We discussed with [name] that there are other genes that are associated with increased cancer risk that can be analyzed. The laboratories that offer such testing look at these additional  genes via a hereditary cancer gene panel. Should [name] wish to pursue additional genetic testing, we are happy to discuss and coordinate this testing, at any time.    CANCER SCREENING RECOMMENDATIONS: Given [name]'s personal and family histories, we must interpret these negative results with some caution.  Families with features suggestive of hereditary risk for cancer tend to have multiple family members with cancer, diagnoses in multiple generations and diagnoses before the age of 6. [name] family exhibits some of these features. Thus this result may simply reflect our current inability to detect all mutations within these genes or there may be a different gene that has not yet been discovered or tested.   This negative genetic test simply tells [place] that we cannot yet define why [name] has had colorectal cancer at a young age. [name] medical management and screening should be based on the prospect that she may be at an increased risk for a second colorectal cancer in the future and should, therefore, undergo more frequent colonoscopy screening at intervals determined by her GI providers.  We also recommended that [name] have an upper endoscopy periodically.  RECOMMENDATIONS FOR FAMILY MEMBERS: Women in this family might be at some increased risk of developing cancer, over the general population risk, simply due to the family history of cancer. We recommended women in this family have a yearly mammogram beginning at age 25, or 28 years younger than the earliest onset of cancer, an an annual clinical breast exam, and perform monthly breast self-exams. Women in this family should also have a gynecological exam as recommended by  their primary provider. All family members should have a colonoscopy by age 50.  FOLLOW-UP: Lastly, we discussed with [name] that cancer genetics is a rapidly advancing field and it is possible that new genetic tests will be appropriate for her and/or  her family members in the future. We encouraged her to remain in contact with cancer genetics on an annual basis so we can update her personal and family histories and let her know of advances in cancer genetics that may benefit this family.   Our contact number was provided. [name]'s questions were answered to her satisfaction, and she knows she is welcome to call us at anytime with additional questions or concerns.   Karen Powell, MS, CGC Certified Genetic Counselor [email or web address]   

## 2014-09-03 NOTE — Telephone Encounter (Signed)
Busy signal

## 2014-09-03 NOTE — Telephone Encounter (Signed)
Revealed negative genetic testing on the colorectal caner panel.  Explained that she needed to keep in contact with genetics every 1-2 years to ensure that there is not any further updated testing that would be needed.

## 2014-09-04 ENCOUNTER — Encounter: Payer: Self-pay | Admitting: *Deleted

## 2014-12-31 ENCOUNTER — Other Ambulatory Visit: Payer: Medicaid Other

## 2014-12-31 ENCOUNTER — Ambulatory Visit (HOSPITAL_COMMUNITY): Payer: Medicaid Other

## 2015-01-05 ENCOUNTER — Other Ambulatory Visit: Payer: Self-pay | Admitting: *Deleted

## 2015-01-05 DIAGNOSIS — C189 Malignant neoplasm of colon, unspecified: Secondary | ICD-10-CM

## 2015-01-06 ENCOUNTER — Ambulatory Visit (HOSPITAL_COMMUNITY): Payer: Medicaid Other

## 2015-01-06 ENCOUNTER — Other Ambulatory Visit: Payer: Medicaid Other

## 2015-01-07 ENCOUNTER — Ambulatory Visit: Payer: Medicaid Other | Admitting: Hematology

## 2015-01-12 ENCOUNTER — Ambulatory Visit (HOSPITAL_COMMUNITY)
Admission: RE | Admit: 2015-01-12 | Discharge: 2015-01-12 | Disposition: A | Discharge status: Home / Self Care | Payer: Self-pay | Source: Ambulatory Visit | Attending: Hematology | Admitting: Hematology

## 2015-01-12 ENCOUNTER — Other Ambulatory Visit (HOSPITAL_BASED_OUTPATIENT_CLINIC_OR_DEPARTMENT_OTHER): Payer: Self-pay

## 2015-01-12 ENCOUNTER — Encounter (HOSPITAL_COMMUNITY): Payer: Self-pay

## 2015-01-12 DIAGNOSIS — C189 Malignant neoplasm of colon, unspecified: Secondary | ICD-10-CM | POA: Insufficient documentation

## 2015-01-12 DIAGNOSIS — C185 Malignant neoplasm of splenic flexure: Secondary | ICD-10-CM

## 2015-01-12 DIAGNOSIS — K769 Liver disease, unspecified: Secondary | ICD-10-CM | POA: Insufficient documentation

## 2015-01-12 LAB — COMPREHENSIVE METABOLIC PANEL (CC13)
ALT: 11 U/L (ref 0–55)
AST: 20 U/L (ref 5–34)
Albumin: 4.1 g/dL (ref 3.5–5.0)
Alkaline Phosphatase: 46 U/L (ref 40–150)
Anion Gap: 8 mEq/L (ref 3–11)
BUN: 11.7 mg/dL (ref 7.0–26.0)
CHLORIDE: 105 meq/L (ref 98–109)
CO2: 27 mEq/L (ref 22–29)
CREATININE: 0.7 mg/dL (ref 0.6–1.1)
Calcium: 9.9 mg/dL (ref 8.4–10.4)
EGFR: >90 mL/min/{1.73_m2} (ref >90)
Glucose: 120 mg/dl (ref 70–140)
POTASSIUM: 4.6 meq/L (ref 3.5–5.1)
Sodium: 140 mEq/L (ref 136–145)
Total Bilirubin: 0.56 mg/dL (ref 0.20–1.20)
Total Protein: 7.6 g/dL (ref 6.4–8.3)

## 2015-01-12 LAB — CBC WITH DIFFERENTIAL/PLATELET
BASO%: 0.3 % (ref 0.0–2.0)
Basophils Absolute: 0 10*3/uL (ref 0.0–0.1)
EOS%: 0.4 % (ref 0.0–7.0)
Eosinophils Absolute: 0 10*3/uL (ref 0.0–0.5)
HCT: 39.6 % (ref 34.8–46.6)
HGB: 13.5 g/dL (ref 11.6–15.9)
LYMPH#: 2 10*3/uL (ref 0.9–3.3)
LYMPH%: 28.4 % (ref 14.0–49.7)
MCH: 31.8 pg (ref 25.1–34.0)
MCHC: 34.1 g/dL (ref 31.5–36.0)
MCV: 93.4 fL (ref 79.5–101.0)
MONO#: 0.7 10*3/uL (ref 0.1–0.9)
MONO%: 10.8 % (ref 0.0–14.0)
NEUT#: 4.1 10*3/uL (ref 1.5–6.5)
NEUT%: 60.1 % (ref 38.4–76.8)
Platelets: 234 10*3/uL (ref 145–400)
RBC: 4.24 10*6/uL (ref 3.70–5.45)
RDW: 15.3 % — ABNORMAL HIGH (ref 11.2–14.5)
WBC: 6.9 10*3/uL (ref 3.9–10.3)

## 2015-01-12 MED ORDER — IOHEXOL 300 MG/ML  SOLN
100.0000 mL | Freq: Once | INTRAMUSCULAR | Status: AC | PRN
  Administered 2015-01-12: 100 mL via INTRAVENOUS

## 2015-01-13 ENCOUNTER — Telehealth: Payer: Self-pay | Admitting: Hematology

## 2015-01-13 ENCOUNTER — Encounter: Payer: Self-pay | Admitting: Hematology

## 2015-01-13 ENCOUNTER — Ambulatory Visit (HOSPITAL_BASED_OUTPATIENT_CLINIC_OR_DEPARTMENT_OTHER): Payer: Self-pay | Admitting: Hematology

## 2015-01-13 VITALS — BP 134/79 | HR 71 | Temp 98.2°F | Resp 18 | Ht 66.0 in | Wt 145.3 lb

## 2015-01-13 DIAGNOSIS — C189 Malignant neoplasm of colon, unspecified: Secondary | ICD-10-CM

## 2015-01-13 DIAGNOSIS — Z85038 Personal history of other malignant neoplasm of large intestine: Secondary | ICD-10-CM

## 2015-01-13 DIAGNOSIS — K769 Liver disease, unspecified: Secondary | ICD-10-CM

## 2015-01-13 DIAGNOSIS — N2889 Other specified disorders of kidney and ureter: Secondary | ICD-10-CM

## 2015-01-13 LAB — CEA: CEA: 3.1 ng/mL (ref 0.0–5.0)

## 2015-01-13 NOTE — Telephone Encounter (Signed)
Appointments made and avs mailed to patient,ir will call patient when she places the order for  [facility] removal  anne

## 2015-01-13 NOTE — Progress Notes (Signed)
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     PAST THERAPY: S/p resection and 6 months adjuvant FOLFOX chemotherapy. (02/02/2010 through 07/22/2010).   CURRENT THERAPY: None   ONCOLOGY HISTORY: She underwent a colonoscopy by Dr. Teena Irani on 12/13/2009 with biopsy showing adenocarcinoma. Preoperative CEA was 73.7. 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 Armandina Gemma. The tumor focally involved the pancreas. 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 There was focal lymphovascular invasion, but no perineural invasion. All margins were negative. 4 out of 15 lymph nodes were positive. The patient received 6 months of adjuvant FOLFOX in combination with Neulasta from 02/02/2010 through 07/22/2010.   OTHER ISSUES: 1. She presented with iron-deficient anemia.  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 Follow up scan showed a 3.4X2.2 left upper pole mass in kidney, unchanged.  2. Multiple small liver lesions were was found on surveillance scan (CT and MRI) since Jan 2015. Liver biopsy was negative for malignancy   INTERVAL HISTORY: Diane Erickson [age] y.o. female with history of colon cancer. She was last seen by me 6 month ago. She returns for follow up and discuss surveillance CT scan findings.   She is doing well overall. She underwent repeated colonoscopy in march this year, which was normal except internal hemoroids. She denies any signifciant pain, nausea, bloating or other complains. She has mild fatigue, able to tolerating routine activities. Weight is stable.   FH: Her brother had colon cancer at age of 39's and her  mother had lung cancer at age of 18's. No other family history of malignancy.   MEDICAL HISTORY: Past Medical History  Diagnosis Date  . Colon cancer     colon ca dx 8/11  . Family history of colon cancer   . Family history of breast cancer    INTERIM HISTORY: has Malignant neoplasm of colon; Hyperlipidemia; Urinary tract infection; History of colon cancer; Pap smear for cervical cancer screening; Family history of colon cancer; Family history of breast cancer; and Genetic testing on her problem list.    ALLERGIES:  has No Known Allergies.  MEDICATIONS: None   SURGICAL HISTORY:  Past Surgical History  Procedure Laterality Date  . Colon surgery      left colectomy, distal pancreatectomy, splenectomy  . Tubal ligation      1997   REVIEW OF SYSTEMS:   Constitutional: Denies fevers, chills or abnormal weight loss, (+) fatigue  Eyes: Denies blurriness of vision Ears, nose, mouth, throat, and face: Denies mucositis or sore throat Respiratory: Denies cough, wheezes, (+) dyspnea on exertion  Cardiovascular: Denies palpitation, chest discomfort or lower extremity swelling Gastrointestinal:  Denies nausea, heartburn or change in bowel habits Skin: Denies abnormal skin rashes Lymphatics: Denies new lymphadenopathy or easy bruising Neurological:Denies numbness, tingling or new weaknesses Behavioral/Psych: Mood is stable, no new changes  All other systems were reviewed with the patient and are negative.  PHYSICAL EXAMINATION: ECOG PERFORMANCE STATUS: 0 - Asymptomatic BP 134/79 mmHg  Pulse 71  Temp(Src) 98.2 F (36.8 C) (Oral)  Resp 18  Ht $R'5\' 6"'Bu$  (1.676 m)  Wt 145 lb 4.8 oz (65.908 kg)  BMI 23.46 kg/m2  SpO2 100%  LMP  04/21/2012 GENERAL:alert, no distress and comfortable; mildly overweight.  SKIN: skin color, texture, turgor are normal, no rashes or significant lesions; + L portacath EYES: normal, Conjunctiva are pink and non-injected, sclera clear OROPHARYNX:no exudate, no  erythema and lips, buccal mucosa, and tongue normal  NECK: supple, thyroid normal size, non-tender, without nodularity LYMPH:  no palpable lymphadenopathy in the cervical, axillary or supraclavicular LUNGS: clear to auscultation and percussion with normal breathing effort HEART: regular rate & rhythm and no murmurs and no lower extremity edema ABDOMEN:abdomen soft, non-tender and normal bowel sounds; mildline scar well healed.  Musculoskeletal:no cyanosis of digits and no clubbing  NEURO: alert & oriented x 3 with fluent speech, no focal motor/sensory deficits  LABORATORY DATA: CBC Latest Ref Rng 01/12/2015 06/29/2014 03/27/2014  WBC 3.9 - 10.3 10e3/uL 6.9 6.0 6.7  Hemoglobin 11.6 - 15.9 g/dL 13.5 13.7 12.9  Hematocrit 34.8 - 46.6 % 39.6 40.5 39.4  Platelets 145 - 400 10e3/uL 234 247 323    CMP Latest Ref Rng 01/12/2015 06/29/2014 03/27/2014  Glucose 70 - 140 mg/dl 120 76 84  BUN 7.0 - 26.0 mg/dL 11.7 13.7 9.9  Creatinine 0.6 - 1.1 mg/dL 0.7 0.7 0.7  Sodium 136 - 145 mEq/L 140 143 141  Potassium 3.5 - 5.1 mEq/L 4.6 4.3 4.2  Chloride 98 - 107 mEq/L - - -  CO2 22 - 29 mEq/L $Remove'27 24 26  'oPDomHj$ Calcium 8.4 - 10.4 mg/dL 9.9 9.9 9.2  Total Protein 6.4 - 8.3 g/dL 7.6 7.7 7.6  Albumin 3.3 - 5.5 g/dL - - -  Total Bilirubin 0.20 - 1.20 mg/dL 0.56 0.77 0.43  Alkaline Phos 40 - 150 U/L 46 46 49  AST 5 - 34 U/L $Remo'20 19 17  'nUIUr$ ALT 0 - 55 U/L $Remo'11 7 9   'MMtKt$ CEA (Order [id number])      CEA  Status: Finalresult Visible to patient:  Not Released Nextappt: 01/21/2015 at 11:30 AM in Oncology [facility] Flush Nurse) Dx:  Malignant neoplasm of colon           Ref Range 1d ago  32mo ago  82mo ago     CEA 0.0 - 5.0 ng/mL 3.1 2.8 2.8          RADIOGRAPHIC STUDIES: 08/28/2013 Colon cancer diagnosed 8/11. Chemotherapy complete.  EXAM: CT ABDOMEN WITH CONTRAST TECHNIQUE:  Multidetector CT imaging of the abdomen was performed using the standard protocol following bolus administration of  intravenous contrast. CONTRAST: 119mL OMNIPAQUE IOHEXOL 300 MG/ML SOLN COMPARISON: US BIOPSY dated 06/05/2013; MR ABDOMEN WO/W CM dated  05/29/2013; CT ABD/PELVIS W CM dated 05/19/2013; CT ABD/PELVIS W CM dated 05/20/2012; CT ABD/PELVIS W CM dated 08/17/2010; CT ABD/PELVIS W CM dated 12/15/2009 FINDINGS: Lower Chest: Mild scarring at the left lung base. Heart size upper normal, without pericardial or pleural effusion. Abdomen: Mild hepatic steatosis. Hyperenhancing hepatic focus adjacent the gallbladder measures 1.2 cm on image 21, similar. A smaller hyperenhancing focus in the right lobe on image 22 is also felt to be similar. No new liver lesions. Splenectomy. Normal stomach, pancreas, gallbladder, biliary tract, adrenal glands. An upper pole right renal cyst. Upper pole left renal mass. 3.4 x 2.2 cm on image 11, of series 5, unchanged. At least partially duplicated right renal collecting system. Too small to characterize interpolar left renal lesion. Aortic atherosclerosis. No retroperitoneal or retrocrural adenopathy. Normal abdominal bowel loops, without ascites. No evidence of omental or peritoneal disease. Bones/Musculoskeletal: No acute osseous abnormality. IMPRESSION: 1. Similar hyperenhancing foci within the right lobe  of the liver. These were better evaluated on 05/29/2013 MRI. 2. Otherwise, no evidence of metastatic disease within the abdomen. 3. Upper pole left renal mass, similar. This remains suspicious for a relatively indolent neoplasm.   MRI abdomen 04/10/2014  IMPRESSION: Stable multifocal hypervascular liver lesions, largest measuring 1.7 cm, most consistent with benign etiology such as focal nodular hyperplasia. No definite evidence of liver metastases. Continued followup by MRI recommended.  Stable 3.6 cm solid enhancing mass in the upper pole the left kidney, consistent with low-grade renal cell carcinoma.  No new or progressive disease identified within the abdomen.  CT abdomen and  pelvis w contrast 01/12/2015  IMPRESSION: Enhancing focus in RIGHT lobe liver appears larger than on the prior CT though axial dimension appears grossly similar to prior MR.  The differences in the CT measurements could be due to differences in technique with different phases of contrast enhancement.  Additional stable 8 mm hypervascular focus RIGHT lobe liver.  If patient has a history of a high greater worrisome colon cancer, may consider followup PET-CT imaging to characterize this lesion, which would have the additional benefit of also allowing metabolic assessment of the 3.5 cm diameter mass at the upper pole LEFT kidney as well as the enhancing soft tissue in the RIGHT adnexa.   PATHOLOGY:  06/05/2013 Diagnosis Liver, needle/core biopsy - BENIGN LIVER.- NO EVIDENCE OF MALIGNANCY, SEE COMMENT.Diagnosis Note  There is no evidence of malignancy and the biopsy is likely not representative of the lesion in question. These findings correlate with those of the liver fine needle aspiration ([id number]).Vicente Males MD Pathologist, Electronic Signature (Case signed 06/06/2013)  ASSESSMENT: Diane Erickson 54 y.o. female with a history of stage III colon cancer.  She was found to have multiple (at least 4) small hyperenhancing liver lesions on MRI in 05/2013, and liver biopsy was negative for malignancy. She also has a stable left kidney mass, measuring about 3.6 cm.  1. Colon cancer stage III, MSI unknown -She is clinically doing very well, no clinical evidence of disease recurrence. It has been almost 5 years since her initial diagnosis, no definitive evidence of recurrence  -I discussed her recent CT scan findings. One of her liver lesion is slightly bigger than before, although it could be different scan technology.  -lab reviewed, all WNL, including CEA -We discussed that we typically do surveillance for 5 years. Due to her indeterminated liver lesions, we will still follow her. -Due to her  significant family history of colon cancer and a young age of onset, I referred her to genetic counseling. The genetic test was negative.   2. Liver lesions, likely benign biopsy negative in earlier 2015. -follow up scan showed overall stable. One liver lesion was biopsied in earlier 2015, which was negative.  -given the slight change on recent CT scan, I will obtain a abdominal MRI in 3 month. If it is stable, I will probably not repeat routine scan afterwards.     3. 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 The patient knows to call the clinic with any problems, questions or concerns. We can certainly see the patient much sooner if necessary.  I spent 20 minutes counseling the patient face to face. The total time spent in the appointment was 30 minutes.  Truitt Merle, MD 01/13/2015 7:39 AM

## 2015-01-21 ENCOUNTER — Telehealth: Payer: Self-pay | Admitting: *Deleted

## 2015-01-27 NOTE — Telephone Encounter (Signed)
error 

## 2015-01-29 ENCOUNTER — Telehealth: Payer: Self-pay | Admitting: Hematology

## 2015-01-29 NOTE — Telephone Encounter (Signed)
pt cld to sch a flush appt-gave pt time & date

## 2015-02-02 ENCOUNTER — Ambulatory Visit (HOSPITAL_BASED_OUTPATIENT_CLINIC_OR_DEPARTMENT_OTHER): Payer: Self-pay

## 2015-02-02 DIAGNOSIS — C189 Malignant neoplasm of colon, unspecified: Secondary | ICD-10-CM

## 2015-02-02 DIAGNOSIS — Z452 Encounter for adjustment and management of vascular access device: Secondary | ICD-10-CM

## 2015-02-02 DIAGNOSIS — Z95828 Presence of other vascular implants and grafts: Secondary | ICD-10-CM

## 2015-02-02 MED ORDER — HEPARIN SOD (PORK) LOCK FLUSH 100 UNIT/ML IV SOLN
500.0000 [IU] | Freq: Once | INTRAVENOUS | Status: AC
Start: 2015-02-02 — End: 2015-02-02
  Administered 2015-02-02: 500 [IU] via INTRAVENOUS
  Filled 2015-02-02: qty 5

## 2015-02-02 MED ORDER — SODIUM CHLORIDE 0.9 % IJ SOLN
10.0000 mL | INTRAMUSCULAR | Status: DC | PRN
  Administered 2015-02-02: 10 mL via INTRAVENOUS
  Filled 2015-02-02: qty 10

## 2015-02-02 NOTE — Patient Instructions (Signed)

## 2015-04-14 ENCOUNTER — Ambulatory Visit (HOSPITAL_COMMUNITY)
Admission: RE | Admit: 2015-04-14 | Discharge: 2015-04-14 | Disposition: A | Discharge status: Home / Self Care | Payer: Self-pay | Source: Ambulatory Visit | Attending: Hematology | Admitting: Hematology

## 2015-04-14 DIAGNOSIS — C189 Malignant neoplasm of colon, unspecified: Secondary | ICD-10-CM | POA: Insufficient documentation

## 2015-04-14 DIAGNOSIS — C642 Malignant neoplasm of left kidney, except renal pelvis: Secondary | ICD-10-CM | POA: Insufficient documentation

## 2015-04-14 DIAGNOSIS — K769 Liver disease, unspecified: Secondary | ICD-10-CM | POA: Insufficient documentation

## 2015-04-14 DIAGNOSIS — E279 Disorder of adrenal gland, unspecified: Secondary | ICD-10-CM | POA: Insufficient documentation

## 2015-04-14 MED ORDER — GADOBENATE DIMEGLUMINE 529 MG/ML IV SOLN
13.0000 mL | Freq: Once | INTRAVENOUS | Status: AC | PRN
  Administered 2015-04-14: 13 mL via INTRAVENOUS

## 2015-04-16 ENCOUNTER — Encounter: Payer: Medicaid Other | Admitting: Hematology

## 2015-04-16 ENCOUNTER — Encounter: Payer: Self-pay | Admitting: Hematology

## 2015-04-16 NOTE — Progress Notes (Signed)
No show  This encounter was created in error - please disregard.

## 2015-04-19 ENCOUNTER — Telehealth: Payer: Self-pay | Admitting: Hematology

## 2015-04-19 NOTE — Telephone Encounter (Signed)
per pof to call to r/s missed appt-left pt a message and adv to call and r/s missed 12/9 appt

## 2015-12-15 ENCOUNTER — Other Ambulatory Visit: Payer: Self-pay | Admitting: Hematology

## 2015-12-15 ENCOUNTER — Telehealth: Payer: Self-pay | Admitting: *Deleted

## 2015-12-15 DIAGNOSIS — C189 Malignant neoplasm of colon, unspecified: Secondary | ICD-10-CM

## 2015-12-15 NOTE — Telephone Encounter (Signed)
I will send a message to scheduling.   Diane Erickson  12/15/2015

## 2015-12-15 NOTE — Telephone Encounter (Signed)
"  I just received a new Medicaid card.  Was told it's a valid card and need an appointent to see Dr. Burr Medico.  Return number [phone]."  Last visit was 01-12-2015.

## 2015-12-17 ENCOUNTER — Telehealth: Payer: Self-pay | Admitting: Hematology

## 2015-12-17 NOTE — Telephone Encounter (Signed)
left msg confirming 8/18 lab/flush apt times

## 2015-12-24 ENCOUNTER — Other Ambulatory Visit: Payer: Self-pay

## 2022-06-26 ENCOUNTER — Encounter: Payer: Self-pay | Admitting: Student

## 2022-06-26 ENCOUNTER — Other Ambulatory Visit: Payer: Self-pay

## 2022-06-26 ENCOUNTER — Ambulatory Visit: Payer: Medicaid Other | Admitting: Student

## 2022-06-26 VITALS — BP 130/83 | HR 83 | Temp 98.5°F | Resp 28 | Ht 66.0 in | Wt 157.3 lb

## 2022-06-26 DIAGNOSIS — Z1231 Encounter for screening mammogram for malignant neoplasm of breast: Secondary | ICD-10-CM

## 2022-06-26 DIAGNOSIS — Z131 Encounter for screening for diabetes mellitus: Secondary | ICD-10-CM | POA: Diagnosis not present

## 2022-06-26 DIAGNOSIS — R202 Paresthesia of skin: Secondary | ICD-10-CM | POA: Insufficient documentation

## 2022-06-26 DIAGNOSIS — Z0000 Encounter for general adult medical examination without abnormal findings: Secondary | ICD-10-CM

## 2022-06-26 DIAGNOSIS — E785 Hyperlipidemia, unspecified: Secondary | ICD-10-CM

## 2022-06-26 DIAGNOSIS — C189 Malignant neoplasm of colon, unspecified: Secondary | ICD-10-CM | POA: Diagnosis present

## 2022-06-26 DIAGNOSIS — Z7689 Persons encountering health services in other specified circumstances: Secondary | ICD-10-CM | POA: Insufficient documentation

## 2022-06-26 DIAGNOSIS — R200 Anesthesia of skin: Secondary | ICD-10-CM

## 2022-06-26 DIAGNOSIS — Z1159 Encounter for screening for other viral diseases: Secondary | ICD-10-CM

## 2022-06-26 NOTE — Progress Notes (Signed)
CC: re-establish care  HPI:  DianeDiane Erickson is a 62 y.o. person with medical history as below presenting to [facility] to re-establish care.   Please see problem-based list for further details, assessments, and plans.  Past Medical History:  Diagnosis Date   Colon cancer ([place])    colon ca dx 8/11   Family history of breast cancer    Family history of colon cancer    Review of Systems:  As per HPI  Physical Exam:  Vitals:   06/26/22 0832  BP: 130/83  Pulse: 83  Resp: (!) 28  Temp: 98.5 F (36.9 C)  TempSrc: Oral  SpO2: 99%  Weight: 157 lb 4.8 oz (71.4 kg)  Height: 5' 6"$  (1.676 m)   General: Resting comfortably in no acute distress HENT: Normocephalic, atraumatic. No cervical or supraclavicular lymphadenopathy. CV: Regular rate, rhythm. No murmurs appreciated. Warm extremities. Pulm: Normal work of breathing on room air. Clear to auscultation bilaterally.  GI: Midline surgical scare. Abdomen soft, non-tender, non-distended. Normoactive bowel sounds. MSK: Normal bulk, tone. No peripheral edema appreciated.  Skin: Warm, dry. No rashes or lesions noted. Left upper chest without any overlying erythema or warmth. Port appreciated just under dermis.  Neuro: Awake, alert, conversing appropriately. Grossly non-focal. Sensation in tact throughout.  Psych: Normal mood, affect, speech.   Assessment & Plan:   Encounter to establish care Diane Erickson is presenting to clinic today to re-establish care with our clinic. She was last seen with [place] in 2016 but was lost to follow-up due to loss of health insurance. Since that time she reports she has been doing well overall, no new symptoms. She does have history of colon cancer s/p distal pancreatectomy and splenectomy in 2011 and subsequently received 40moof chemotherapy afterward. Otherwise she denies any other significant medical history. She does not take any medications or supplements regularly. She denies tobacco, alcohol, or recreational  drug use. Denies fevers, chills, weight loss, significant night sweats, fatigue, loss of appetite, chest pain, palpitations, dyspnea, nausea, vomiting, abdominal pain, dysuria, polyuria, constipation, diarrhea. She denies any SI.   Last PAP smear in 2016, no previous abnormal results. Last mammogram 2016, no previous abnormal results. Last Tdap in 2011, she is not interested in receiving this today. No previous COVID-19 vaccines, she is also not interested in this. No previous Shingles vaccines. No previous influenza vaccines.   Today, we will order a mammogram and hepatitis C antibody for health maintenance screening. She is also due for an updated screening for diabetes and hyperlipidemia, which we will obtain today.   Healthcare maintenance - Hepatitis C screening today - Mammogram ordered today - Not interested in vaccines (Shingles, COVID-19, Tdap, influenza) - Patient would like to defer PAP  Malignant neoplasm of colon Patient was previously found to have colon cancer in 2011 with the tumor focally involving pancreas. She had distal pancreatectomy and splenectomy in 12/2009. Pathology revealed focal lymphovascular invasion with 4/15 lymph nodes positive. She then completed 676mof chemotherapy. In 2015 she was found to have new liver lesion, biopsy was benign. Unfortunately after this she was lost to follow-up due to lack of insurance. Today, she reports no new symptoms, including no fatigue, weight loss, significant night sweats, constipation, diarrhea, abdominal pain. She does still have the port in the left part of her chest. Per last oncology note, she was to still follow-up with them due to the new liver lesion. We will refer her back to oncology to see if there is any  further work-up still needed. Will also obtain CMP.  - Follow-up CMP - Referral to heme/onc  Paresthesias Patient reports intermittent paresthesias in her fingertips and toes recently. She states these occur typically  after eating, but she does not know of any association with a particular type of food. She does report when these paresthesias come she also feels numb. She denies any focal weaknesses, changes in skin/hair, heat/cold tolerance, loss of balance.   Discussed with Diane Erickson it is unlikely these are due to an allergic reaction to food. Also unlikely to be spinal cord related given it is occurring in all four extremities and is episodic with meals. Her exam was normal with good pulses and good sensation throughout. Today we will plan to screen for diabetes and check thyroid, B12 studies.  - Follow-up A1c, TSH, vitamin B12   Patient discussed with Dr. Denita Lung, MD Internal Medicine PGY-3 Pager: [phone]

## 2022-06-26 NOTE — Assessment & Plan Note (Signed)
-   Hepatitis C screening today - Mammogram ordered today - Not interested in vaccines (Shingles, COVID-19, Tdap, influenza) - Patient would like to defer PAP

## 2022-06-26 NOTE — Assessment & Plan Note (Addendum)
Patient was previously found to have colon cancer in 2011 with the tumor focally involving pancreas. She had distal pancreatectomy and splenectomy in 12/2009. Pathology revealed focal lymphovascular invasion with 4/15 lymph nodes positive. She then completed 23moof chemotherapy. In 2015 she was found to have new liver lesion, biopsy was benign. Unfortunately after this she was lost to follow-up due to lack of insurance. Today, she reports no new symptoms, including no fatigue, weight loss, significant night sweats, constipation, diarrhea, abdominal pain. She does still have the port in the left part of her chest. Per last oncology note, she was to still follow-up with them due to the new liver lesion. We will refer her back to oncology to see if there is any further work-up still needed. Will also obtain CMP.  - Follow-up CMP - Referral to heme/onc

## 2022-06-26 NOTE — Assessment & Plan Note (Signed)
Patient reports intermittent paresthesias in her fingertips and toes recently. She states these occur typically after eating, but she does not know of any association with a particular type of food. She does report when these paresthesias come she also feels numb. She denies any focal weaknesses, changes in skin/hair, heat/cold tolerance, loss of balance.   Discussed with Diane Erickson it is unlikely these are due to an allergic reaction to food. Also unlikely to be spinal cord related given it is occurring in all four extremities and is episodic with meals. Her exam was normal with good pulses and good sensation throughout. Today we will plan to screen for diabetes and check thyroid, B12 studies.  - Follow-up A1c, TSH, vitamin B12

## 2022-06-26 NOTE — Assessment & Plan Note (Addendum)
Diane Erickson is presenting to clinic today to re-establish care with our clinic. She was last seen with [place] in 2016 but was lost to follow-up due to loss of health insurance. Since that time she reports she has been doing well overall, no new symptoms. 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 Otherwise she denies any other significant medical history. She does not take any medications or supplements regularly. She denies tobacco, alcohol, or recreational drug use. Denies fevers, chills, weight loss, significant night sweats, fatigue, loss of appetite, chest pain, palpitations, dyspnea, nausea, vomiting, abdominal pain, dysuria, polyuria, constipation, diarrhea. She denies any SI.   Last PAP smear in 2016, no previous abnormal results. Last mammogram 2016, no previous abnormal results. Last Tdap in 2011, she is not interested in receiving this today. No previous COVID-19 vaccines, she is also not interested in this. No previous Shingles vaccines. No previous influenza vaccines.   Today, we will order a mammogram and hepatitis C antibody for health maintenance screening. She is also due for an updated screening for diabetes and hyperlipidemia, which we will obtain today.

## 2022-06-26 NOTE — Patient Instructions (Signed)
[name], it was a pleasure seeing you today!  Today we discussed: - We are happy to have you back in our clinic! Today we are going to get lab work to check your kidneys, liver, cholesterol, thyroid function, and vitamin B12. We will also screen for diabetes and hepatitis C.  - I have referred you back to the oncologist and have ordered a mammogram. You will receive calls to schedule these.  I have ordered the following labs today:   Lab Orders         TSH         Vitamin B12         Hemoglobin A1c         CMP14 + Anion Gap         Lipid Profile         Hepatitis C Ab reflex to Quant PCR      Referrals ordered today:    Referral Orders         Ambulatory referral to Hematology / Oncology       Follow-up: 6 months   Please make sure to arrive 15 minutes prior to your next appointment. If you arrive late, you may be asked to reschedule.   We look forward to seeing you next time. Please call our clinic at [phone] if you have any questions or concerns. The best time to call is Monday-Friday from 9am-4pm, but there is someone available 24/7. If after hours or the weekend, call the main hospital number and ask for the Internal Medicine Resident On-Call. If you need medication refills, please notify your pharmacy one week in advance and they will send [place] a request.  Thank you for letting us take part in your care. Wishing you the best!  Thank you, Sanjuan Dame, MD

## 2022-06-27 LAB — CMP14 + ANION GAP
AST: 16 IU/L (ref 0–40)
Albumin/Globulin Ratio: 1.4 (ref 1.2–2.2)
Albumin: 4.3 g/dL (ref 3.8–4.9)
Alkaline Phosphatase: 56 IU/L (ref 44–121)
Anion Gap: 14 mmol/L (ref 10.0–18.0)
Calcium: 9.5 mg/dL (ref 8.7–10.2)
Creatinine, Ser: 0.57 mg/dL (ref 0.57–1.00)
Potassium: 4.5 mmol/L (ref 3.5–5.2)

## 2022-06-27 LAB — LIPID PANEL
HDL: 77 mg/dL (ref >39)
Triglycerides: 79 mg/dL (ref 0–149)
VLDL Cholesterol Cal: 13 mg/dL (ref 5–40)

## 2022-06-27 LAB — HEMOGLOBIN A1C: Est. average glucose Bld gHb Est-mCnc: 108 mg/dL

## 2022-06-27 LAB — HCV INTERPRETATION

## 2022-06-27 LAB — HCV AB W REFLEX TO QUANT PCR: HCV Ab: NONREACTIVE

## 2022-06-28 LAB — LIPID PANEL
Chol/HDL Ratio: 3.2 ratio (ref 0.0–4.4)
Cholesterol, Total: 246 mg/dL — ABNORMAL HIGH (ref 100–199)
LDL Chol Calc (NIH): 156 mg/dL — ABNORMAL HIGH (ref 0–99)

## 2022-06-28 LAB — CMP14 + ANION GAP
ALT: 7 IU/L (ref 0–32)
BUN/Creatinine Ratio: 26 — ABNORMAL HIGH (ref 9–23)
BUN: 15 mg/dL (ref 6–24)
Bilirubin Total: 0.3 mg/dL (ref 0.0–1.2)
CO2: 23 mmol/L (ref 20–29)
Chloride: 102 mmol/L (ref 96–106)
Globulin, Total: 3 g/dL (ref 1.5–4.5)
Glucose: 106 mg/dL — ABNORMAL HIGH (ref 70–99)
Sodium: 139 mmol/L (ref 134–144)
Total Protein: 7.3 g/dL (ref 6.0–8.5)
eGFR: 105 mL/min/{1.73_m2} (ref >59)

## 2022-06-28 LAB — TSH: TSH: 2.09 u[IU]/mL (ref 0.450–4.500)

## 2022-06-28 LAB — HEMOGLOBIN A1C: Hgb A1c MFr Bld: 5.4 % (ref 4.8–5.6)

## 2022-06-28 LAB — VITAMIN B12: Vitamin B-12: 623 pg/mL (ref 232–1245)

## 2022-06-28 NOTE — Progress Notes (Signed)
Internal Medicine Clinic Attending  Case discussed with the resident at the time of the visit.  We reviewed the resident's history and exam and pertinent patient test results.  I agree with the assessment, diagnosis, and plan of care documented in the resident's note.  

## 2022-07-24 DIAGNOSIS — Z95828 Presence of other vascular implants and grafts: Secondary | ICD-10-CM

## 2022-07-24 NOTE — Progress Notes (Signed)
We received a repeat referral from patient's PCP.  Patient is requesting her port be removed.  She has been lost to follow up.  She finished treatment in 2012 and per Dr Burr Medico does not need to be seen by medical oncology but ok to place referral to Dr Harlow Asa to have port removed.  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  Dr Burr Medico states that PCP should order MRI to reassess this as it is not felt to be metastatic disease.

## 2022-07-24 NOTE — Progress Notes (Signed)
I spoke with Ms Forry and explained why she does not need to be seen by medical oncology at this time.  I told her I will fax a referral to Dr Gala Lewandowsky office for port removal.  All questions were answered.  She verbalized understanding.

## 2022-07-25 NOTE — Progress Notes (Signed)
Referral, demographics, insurance information , and operative note faxed to [facility]-Dr Gerkin for port removal.

## 2022-08-23 ENCOUNTER — Ambulatory Visit
Admission: RE | Admit: 2022-08-23 | Discharge: 2022-08-23 | Disposition: A | Discharge status: Home / Self Care | Payer: Medicaid Other | Source: Ambulatory Visit | Attending: Internal Medicine | Admitting: Internal Medicine

## 2022-08-23 DIAGNOSIS — Z1231 Encounter for screening mammogram for malignant neoplasm of breast: Secondary | ICD-10-CM

## 2022-09-04 ENCOUNTER — Telehealth: Payer: Self-pay

## 2022-09-04 NOTE — Telephone Encounter (Signed)
Hey Dr.Braswell patient called she is requesting a referral for a eye doctor,will she needs to be seen for a fu visit first or will you be able to place the referral now?

## 2022-09-06 ENCOUNTER — Ambulatory Visit: Payer: Medicaid Other | Admitting: Internal Medicine

## 2022-09-06 ENCOUNTER — Encounter: Payer: Self-pay | Admitting: Internal Medicine

## 2022-09-06 ENCOUNTER — Other Ambulatory Visit: Payer: Self-pay

## 2022-09-06 VITALS — BP 117/78 | HR 76 | Temp 98.3°F | Resp 24 | Ht 66.0 in | Wt 152.1 lb

## 2022-09-06 DIAGNOSIS — C189 Malignant neoplasm of colon, unspecified: Secondary | ICD-10-CM

## 2022-09-06 DIAGNOSIS — Z0000 Encounter for general adult medical examination without abnormal findings: Secondary | ICD-10-CM

## 2022-09-06 NOTE — Patient Instructions (Addendum)
Dear Mrs. Diane Erickson,  Thank you for trusting [place] with your care.   We discussed your liver spot, port, eyes, and dental exams.  We will order a liver MRI for you.  Please call the surgery office about the port.  I will place referrals to the eye dr and dentist.  Please follow up in 3 months.

## 2022-09-06 NOTE — Progress Notes (Signed)
NA

## 2022-09-09 ENCOUNTER — Encounter: Payer: Self-pay | Admitting: Student

## 2022-09-14 NOTE — Progress Notes (Signed)
Diane Erickson was seen by Dr. Sharman Crate and I discussed the care with him he is unable to document the note.  This is a no charge encounter.  Patient will follow-up with surgery for port removal and MRI has been ordered to evaluate liver mass.  Referrals were placed for ophthalmology and dentistry, she has been having some blurry vision, no reported dental pain or issue but notes she is overdue for evaluation.  Think she has had a Pap last March with [facility] we have requested records.

## 2022-11-16 ENCOUNTER — Ambulatory Visit: Payer: Self-pay | Admitting: Surgery

## 2022-11-19 ENCOUNTER — Encounter (HOSPITAL_BASED_OUTPATIENT_CLINIC_OR_DEPARTMENT_OTHER): Payer: Self-pay | Admitting: Surgery

## 2022-11-19 DIAGNOSIS — Z95828 Presence of other vascular implants and grafts: Secondary | ICD-10-CM

## 2022-11-19 NOTE — H&P (Signed)
REFERRING PHYSICIAN: Runell Gess, MD  PROVIDER: Jaila Schellhorn Myra Rude, MD   Chief Complaint: New Consultation (Hx of colon cancer, infusion port removal needed)  History of Present Illness:  Patient is referred for removal of infusion port. Patient has not been seen in my practice in many many years. Patient had undergone an open laparotomy with partial colectomy, distal pancreatectomy, and splenectomy for a locally advanced carcinoma of the colon. Infusion port was subsequently placed for adjuvant chemotherapy. Patient had been lost to follow-up for many years. She now presents on referral by her primary care physician for removal of her infusion port. This has not been used for many years. Patient is currently doing well. She has no complaints. She desires infusion port removal.  Review of Systems: A complete review of systems was obtained from the patient. I have reviewed this information and discussed as appropriate with the patient. See HPI as well for other ROS.  Review of Systems  Constitutional: Negative.  HENT: Negative.  Eyes: Negative.  Respiratory: Negative.  Cardiovascular: Negative.  Gastrointestinal: Negative.  Genitourinary: Negative.  Musculoskeletal: Negative.  Skin: Negative.  Neurological: Negative.  Endo/Heme/Allergies: Negative.  Psychiatric/Behavioral: Negative.    Medical History: Past Medical History:  Diagnosis Date  History of cancer   Patient Active Problem List  Diagnosis  Malignant neoplasm of colon (CMS/HHS-HCC)  Hyperlipidemia  Paresthesias  History of colon cancer  Port-A-Cath in place   Past Surgical History:  Procedure Laterality Date  COLON SURGERY  ESSURE TUBAL LIGATION    No Known Allergies  No current outpatient medications on file prior to visit.   No current facility-administered medications on file prior to visit.   Family History  Problem Relation Age of Onset  Lung cancer Mother  Diabetes Father  High  blood pressure (Hypertension) Brother  Hyperlipidemia (Elevated cholesterol) Brother  Coronary Artery Disease (Blocked arteries around heart) Brother  Diabetes Brother  Colon cancer Brother  Diabetes Maternal Grandmother    Social History   Tobacco Use  Smoking Status Former  Types: Cigarettes  Smokeless Tobacco Never    Social History   Socioeconomic History  Marital status: Single  Tobacco Use  Smoking status: Former  Types: Cigarettes  Smokeless tobacco: Never  Substance and Sexual Activity  Alcohol use: Defer  Drug use: Never   Social Determinants of Health   Financial Resource Strain: Not on File (08/25/2021)  Received from [employer]  Financial Resource Strain: 0  Food Insecurity: No Food Insecurity (06/26/2022)  Received from [facility]  Hunger Vital Sign  Worried About Running Out of Food in the Last Year: Never true  Ran Out of Food in the Last Year: Never true  Transportation Needs: No Transportation Needs (06/26/2022)  Received from [facility]  Lack of Transportation (Medical): No  Lack of Transportation (Non-Medical): No  Physical Activity: Not on File (08/25/2021)  Received from [facility]  Physical Activity  Physical Activity: 0  Stress: Not on File (08/25/2021)  Received from [facility]  Stress  Stress: 0  Social Connections: Moderately Isolated (06/26/2022)  Received from [facility]  Social Connection and Isolation Panel [NHANES]  Frequency of Communication with Friends and Family: More than three times a week  Frequency of Social Gatherings with Friends and Family: More than three times a week  Attends Religious Services: More than 4 times per year  Active Member of [employer] or Organizations: No  Attends Banker Meetings: Never  Marital Status: Never married  Housing Stability: Not on File (08/25/2021)  Received from [employer]  Housing: 0   Objective:   Vitals:  BP: 132/84   Pulse: 97  Temp: 36.9 C (98.5 F)  SpO2: 98%  Weight: 72.1 kg (159 lb)  Height: 162.6 cm (5\' 4" )  PainSc: 0-No pain   Body mass index is 27.29 kg/m.  Physical Exam   Infusion port remains in place in the upper left chest wall. There is a well-healed 2 cm incision. Port is palpable in the subcutaneous tissues. There is no sign of seroma or inflammation or infection. There is no tenderness.   Assessment and Plan:   History of colon cancer Port-A-Cath in place  Patient presents for removal of infusion port. She has completed all of her therapy and is alive and doing well 11 years after resection of a locally advanced adenocarcinoma of the colon.  Today we discussed removal of her infusion port as an outpatient surgical procedure. We discussed the size and location of the surgical incision. We discussed the postoperative recovery to be anticipated. This will be done as an outpatient surgical procedure. We will make arrangements for for surgery at a time convenient for the patient in the near future.   Darnell Level, MD [facility] practice Office: [phone]

## 2022-11-20 ENCOUNTER — Encounter (HOSPITAL_BASED_OUTPATIENT_CLINIC_OR_DEPARTMENT_OTHER): Payer: Self-pay | Admitting: Surgery

## 2022-11-20 NOTE — Progress Notes (Signed)
Spoke w/ via phone for pre-op interview--- pt Lab needs dos---- no              Lab results------ no COVID test -----patient states asymptomatic no test needed Arrive at ------- 1330 on 11-24-2022 NPO after MN NO Solid Food.  Clear liquids from MN until--- 1230 Med rec completed Medications to take morning of surgery ----- none Diabetic medication ----- n/a Patient instructed no nail polish to be worn day of surgery Patient instructed to bring photo id and insurance card day of surgery Patient aware to have Driver (ride ) / caregiver    for 24 hours after surgery -- daughter, lashay Patient Special Instructions ----- n/a Pre-Op special Instructions ----- n/a Patient verbalized understanding of instructions that were given at this phone interview. Patient denies shortness of breath, chest pain, fever, cough at this phone interview.

## 2022-11-24 DIAGNOSIS — Z01818 Encounter for other preprocedural examination: Secondary | ICD-10-CM

## 2022-11-24 DIAGNOSIS — C189 Malignant neoplasm of colon, unspecified: Secondary | ICD-10-CM | POA: Diagnosis present

## 2022-11-24 DIAGNOSIS — Z95828 Presence of other vascular implants and grafts: Secondary | ICD-10-CM

## 2022-12-10 ENCOUNTER — Encounter (HOSPITAL_BASED_OUTPATIENT_CLINIC_OR_DEPARTMENT_OTHER): Payer: Self-pay | Admitting: Surgery

## 2022-12-14 ENCOUNTER — Encounter (HOSPITAL_BASED_OUTPATIENT_CLINIC_OR_DEPARTMENT_OTHER): Payer: Self-pay | Admitting: Surgery

## 2022-12-14 NOTE — Progress Notes (Signed)
Spoke w/ via phone for pre-op interview--- pt Lab needs dos---- no              Lab results------ no COVID test -----patient states asymptomatic no test needed Arrive at ------- 0645  on 12-21-2022 NPO after MN NO Solid Food.  Clear liquids from MN until--- 0545 Med rec completed Medications to take morning of surgery ----- none Diabetic medication ----- n/a Patient instructed no nail polish to be worn day of surgery Patient instructed to bring photo id and insurance card day of surgery Patient aware to have Driver (ride ) / caregiver    for 24 hours after surgery -- daughter, lashay Patient Special Instructions ----- n/a Pre-Op special Instructions ----- n/a Patient verbalized understanding of instructions that were given at this phone interview. Patient denies shortness of breath, chest pain, fever, cough at this phone interview.

## 2022-12-15 ENCOUNTER — Encounter (HOSPITAL_BASED_OUTPATIENT_CLINIC_OR_DEPARTMENT_OTHER): Payer: Self-pay | Admitting: Surgery

## 2022-12-21 ENCOUNTER — Ambulatory Visit (HOSPITAL_BASED_OUTPATIENT_CLINIC_OR_DEPARTMENT_OTHER)
Admission: RE | Admit: 2022-12-21 | Discharge: 2022-12-21 | Disposition: A | Discharge status: Home / Self Care | Payer: Medicaid Other | Attending: Surgery | Admitting: Surgery

## 2022-12-21 ENCOUNTER — Ambulatory Visit (HOSPITAL_BASED_OUTPATIENT_CLINIC_OR_DEPARTMENT_OTHER): Payer: Self-pay | Admitting: Certified Registered Nurse Anesthetist

## 2022-12-21 ENCOUNTER — Ambulatory Visit (HOSPITAL_BASED_OUTPATIENT_CLINIC_OR_DEPARTMENT_OTHER): Payer: Medicaid Other | Admitting: Certified Registered Nurse Anesthetist

## 2022-12-21 ENCOUNTER — Encounter (HOSPITAL_BASED_OUTPATIENT_CLINIC_OR_DEPARTMENT_OTHER)
Admission: RE | Disposition: A | Discharge status: Home / Self Care | Payer: Self-pay | Source: Home / Self Care | Attending: Surgery

## 2022-12-21 ENCOUNTER — Encounter (HOSPITAL_BASED_OUTPATIENT_CLINIC_OR_DEPARTMENT_OTHER): Payer: Self-pay | Admitting: Surgery

## 2022-12-21 ENCOUNTER — Other Ambulatory Visit: Payer: Self-pay

## 2022-12-21 DIAGNOSIS — Z01818 Encounter for other preprocedural examination: Secondary | ICD-10-CM

## 2022-12-21 DIAGNOSIS — Z85038 Personal history of other malignant neoplasm of large intestine: Secondary | ICD-10-CM | POA: Insufficient documentation

## 2022-12-21 DIAGNOSIS — Z9081 Acquired absence of spleen: Secondary | ICD-10-CM | POA: Diagnosis not present

## 2022-12-21 DIAGNOSIS — Z90411 Acquired partial absence of pancreas: Secondary | ICD-10-CM | POA: Insufficient documentation

## 2022-12-21 DIAGNOSIS — Z452 Encounter for adjustment and management of vascular access device: Secondary | ICD-10-CM | POA: Diagnosis present

## 2022-12-21 DIAGNOSIS — Z9049 Acquired absence of other specified parts of digestive tract: Secondary | ICD-10-CM | POA: Diagnosis not present

## 2022-12-21 DIAGNOSIS — C189 Malignant neoplasm of colon, unspecified: Secondary | ICD-10-CM | POA: Diagnosis present

## 2022-12-21 DIAGNOSIS — Z87891 Personal history of nicotine dependence: Secondary | ICD-10-CM | POA: Diagnosis not present

## 2022-12-21 DIAGNOSIS — Z95828 Presence of other vascular implants and grafts: Secondary | ICD-10-CM

## 2022-12-21 HISTORY — DX: Personal history of antineoplastic chemotherapy: Z92.21

## 2022-12-21 HISTORY — DX: Personal history of diseases of the blood and blood-forming organs and certain disorders involving the immune mechanism: Z86.2

## 2022-12-21 HISTORY — PX: PORT-A-CATH REMOVAL: SHX5289

## 2022-12-21 HISTORY — DX: Unspecified hemorrhoids: K64.9

## 2022-12-21 SURGERY — REMOVAL PORT-A-CATH
Anesthesia: General | Site: Chest

## 2022-12-21 MED ORDER — PROPOFOL 10 MG/ML IV BOLUS: INTRAVENOUS | Status: DC | PRN

## 2022-12-21 MED ORDER — 0.9 % SODIUM CHLORIDE (POUR BTL) OPTIME
TOPICAL | Status: DC | PRN
  Administered 2022-12-21: 500 mL

## 2022-12-21 MED ORDER — FENTANYL CITRATE (PF) 100 MCG/2ML IJ SOLN
INTRAMUSCULAR | Status: AC
  Filled 2022-12-21: qty 2

## 2022-12-21 MED ORDER — DEXAMETHASONE SODIUM PHOSPHATE 10 MG/ML IJ SOLN
INTRAMUSCULAR | Status: DC | PRN
  Administered 2022-12-21: 5 mg via INTRAVENOUS

## 2022-12-21 MED ORDER — FENTANYL CITRATE (PF) 250 MCG/5ML IJ SOLN: INTRAMUSCULAR | Status: DC | PRN

## 2022-12-21 MED ORDER — LIDOCAINE HCL (PF) 1 % IJ SOLN
INTRAMUSCULAR | Status: DC | PRN
  Administered 2022-12-21: 17 mL

## 2022-12-21 MED ORDER — MIDAZOLAM HCL 2 MG/2ML IJ SOLN
INTRAMUSCULAR | Status: DC | PRN
  Administered 2022-12-21: 2 mg via INTRAVENOUS

## 2022-12-21 MED ORDER — FENTANYL CITRATE (PF) 250 MCG/5ML IJ SOLN
INTRAMUSCULAR | Status: DC | PRN
  Administered 2022-12-21: 50 ug via INTRAVENOUS

## 2022-12-21 MED ORDER — CEFAZOLIN SODIUM-DEXTROSE 2-4 GM/100ML-% IV SOLN
2.0000 g | INTRAVENOUS | Status: AC
  Administered 2022-12-21: 2 g via INTRAVENOUS

## 2022-12-21 MED ORDER — ACETAMINOPHEN 500 MG PO TABS
1000.0000 mg | ORAL_TABLET | Freq: Once | ORAL | Status: AC
  Administered 2022-12-21: 1000 mg via ORAL

## 2022-12-21 MED ORDER — MIDAZOLAM HCL 2 MG/2ML IJ SOLN
INTRAMUSCULAR | Status: AC
  Filled 2022-12-21: qty 2

## 2022-12-21 MED ORDER — ACETAMINOPHEN 500 MG PO TABS
ORAL_TABLET | ORAL | Status: AC
  Filled 2022-12-21: qty 2

## 2022-12-21 MED ORDER — PHENYLEPHRINE 80 MCG/ML (10ML) SYRINGE FOR IV PUSH (FOR BLOOD PRESSURE SUPPORT)
PREFILLED_SYRINGE | INTRAVENOUS | Status: DC | PRN
  Administered 2022-12-21: 80 ug via INTRAVENOUS

## 2022-12-21 MED ORDER — CEFAZOLIN SODIUM-DEXTROSE 2-4 GM/100ML-% IV SOLN
INTRAVENOUS | Status: AC
  Filled 2022-12-21: qty 100

## 2022-12-21 MED ORDER — PROPOFOL 10 MG/ML IV BOLUS
INTRAVENOUS | Status: DC | PRN
  Administered 2022-12-21: 150 ug via INTRAVENOUS

## 2022-12-21 MED ORDER — FENTANYL CITRATE (PF) 100 MCG/2ML IJ SOLN: 25.0000 ug | INTRAMUSCULAR | Status: DC | PRN

## 2022-12-21 MED ORDER — CHLORHEXIDINE GLUCONATE CLOTH 2 % EX PADS: 6.0000 | MEDICATED_PAD | Freq: Once | CUTANEOUS | Status: DC

## 2022-12-21 MED ORDER — LACTATED RINGERS IV SOLN: INTRAVENOUS | Status: DC

## 2022-12-21 MED ORDER — ONDANSETRON HCL 4 MG/2ML IJ SOLN
INTRAMUSCULAR | Status: DC | PRN
  Administered 2022-12-21: 4 mg via INTRAVENOUS

## 2022-12-21 MED ORDER — LIDOCAINE 2% (20 MG/ML) 5 ML SYRINGE
INTRAMUSCULAR | Status: DC | PRN
  Administered 2022-12-21: 60 mg via INTRAVENOUS

## 2022-12-21 MED ORDER — MIDAZOLAM HCL 2 MG/2ML IJ SOLN: INTRAMUSCULAR | Status: DC | PRN

## 2022-12-21 MED ORDER — LIDOCAINE 2% (20 MG/ML) 5 ML SYRINGE: INTRAMUSCULAR | Status: DC | PRN

## 2022-12-21 SURGICAL SUPPLY — 31 items
ADH SKN CLS APL DERMABOND .7 (GAUZE/BANDAGES/DRESSINGS) ×1
APL PRP STRL LF DISP 70% ISPRP (MISCELLANEOUS) ×1
BLADE SURG 15 STRL LF DISP TIS (BLADE) ×1 IMPLANT
BLADE SURG 15 STRL SS (BLADE) ×1
CHLORAPREP W/TINT 26 (MISCELLANEOUS) ×1 IMPLANT
COVER BACK TABLE 60X90IN (DRAPES) ×1 IMPLANT
COVER MAYO STAND STRL (DRAPES) ×1 IMPLANT
DERMABOND ADVANCED .7 DNX12 (GAUZE/BANDAGES/DRESSINGS) ×1 IMPLANT
DRAPE LAPAROTOMY 100X72 PEDS (DRAPES) ×1 IMPLANT
DRAPE UTILITY XL STRL (DRAPES) ×1 IMPLANT
ELECT REM PT RETURN 9FT ADLT (ELECTROSURGICAL) ×1
ELECTRODE REM PT RTRN 9FT ADLT (ELECTROSURGICAL) ×1 IMPLANT
GAUZE 4X4 16PLY ~~LOC~~+RFID DBL (SPONGE) ×1 IMPLANT
GLOVE BIO SURGEON STRL SZ8 (GLOVE) ×1 IMPLANT
GOWN STRL REUS W/TWL XL LVL3 (GOWN DISPOSABLE) ×2 IMPLANT
KIT TURNOVER CYSTO (KITS) ×1 IMPLANT
NDL HYPO 25X1 1.5 SAFETY (NEEDLE) ×1 IMPLANT
NEEDLE HYPO 25X1 1.5 SAFETY (NEEDLE) ×1 IMPLANT
NS IRRIG 500ML POUR BTL (IV SOLUTION) ×1 IMPLANT
PACK BASIN DAY SURGERY FS (CUSTOM PROCEDURE TRAY) ×1 IMPLANT
PAD ARMBOARD 7.5X6 YLW CONV (MISCELLANEOUS) ×2 IMPLANT
PENCIL SMOKE EVACUATOR (MISCELLANEOUS) ×1 IMPLANT
SLEEVE SCD COMPRESS KNEE MED (STOCKING) ×1 IMPLANT
SPIKE FLUID TRANSFER (MISCELLANEOUS) ×1 IMPLANT
SUT MNCRL AB 4-0 PS2 18 (SUTURE) ×1 IMPLANT
SUT VIC AB 3-0 SH 27 (SUTURE) ×1
SUT VIC AB 3-0 SH 27X BRD (SUTURE) ×1 IMPLANT
SYR CONTROL 10ML LL (SYRINGE) ×1 IMPLANT
TOWEL OR 17X24 6PK STRL BLUE (TOWEL DISPOSABLE) ×2 IMPLANT
TUBE CONNECTING 12X1/4 (SUCTIONS) IMPLANT
WATER STERILE IRR 500ML POUR (IV SOLUTION) IMPLANT

## 2022-12-21 NOTE — Anesthesia Preprocedure Evaluation (Addendum)
Anesthesia Evaluation  Patient identified by MRN, date of birth, ID band Patient awake    Reviewed: Allergy & Precautions, NPO status , Patient's Chart, lab work & pertinent test results  Airway Mallampati: I  TM Distance: >3 FB Neck ROM: Full    Dental  (+) Missing, Dental Advisory Given, Chipped,    Pulmonary former smoker   Pulmonary exam normal breath sounds clear to auscultation       Cardiovascular Normal cardiovascular exam Rhythm:Regular Rate:Normal  HLD   Neuro/Psych negative neurological ROS  negative psych ROS   GI/Hepatic negative GI ROS, Neg liver ROS,,,  Endo/Other  negative endocrine ROS    Renal/GU negative Renal ROS  negative genitourinary   Musculoskeletal negative musculoskeletal ROS (+)    Abdominal   Peds  Hematology  (+) Blood dyscrasia, anemia   Anesthesia Other Findings H/o colon CA  Reproductive/Obstetrics                             Anesthesia Physical Anesthesia Plan  ASA: 2  Anesthesia Plan: General   Post-op Pain Management: Tylenol PO (pre-op)*   Induction: Intravenous  PONV Risk Score and Plan: 3 and Ondansetron, Dexamethasone and Midazolam  Airway Management Planned: LMA  Additional Equipment:   Intra-op Plan:   Post-operative Plan: Extubation in OR  Informed Consent: I have reviewed the patients History and Physical, chart, labs and discussed the procedure including the risks, benefits and alternatives for the proposed anesthesia with the patient or authorized representative who has indicated his/her understanding and acceptance.     Dental advisory given  Plan Discussed with: CRNA  Anesthesia Plan Comments:        Anesthesia Quick Evaluation

## 2022-12-21 NOTE — Transfer of Care (Signed)
Immediate Anesthesia Transfer of Care Note  Patient: Diane Erickson  Procedure(s) Performed: REMOVAL OF INFUSION PORT (Chest)  Patient Location: PACU  Anesthesia Type:General  Level of Consciousness: awake, alert , and oriented  Airway & Oxygen Therapy: Patient Spontanous Breathing  Post-op Assessment: Report given to RN and Post -op Vital signs reviewed and stable  Post vital signs: Reviewed and stable  Last Vitals:  Vitals Value Taken Time  BP 150/101 12/21/22 0945  Temp 36.4 C 12/21/22 0944  Pulse 99 12/21/22 0947  Resp 23 12/21/22 0947  SpO2 100 % 12/21/22 0947  Vitals shown include unfiled device data.  Last Pain:  Vitals:   12/21/22 0723  TempSrc: Oral  PainSc: 0-No pain      Patients Stated Pain Goal: 7 (12/21/22 0723)  Complications: No notable events documented.

## 2022-12-21 NOTE — Discharge Instructions (Addendum)
  [facility] -- DISCHARGE INSTRUCTIONS  REMINDER:   Carry a list of your medications and allergies with you at all times  Call your pharmacy at least 1 week in advance to refill prescriptions  Do not mix any prescribed pain medicine with alcohol  Do not drive any motor vehicles while taking pain medication  Take medications with food unless otherwise directed  Follow-up appointments (date to return to physician): Please call [phone] to confirm your follow up appointment with your surgeon.  Call your Surgeon if you have:  Temperature greater than 101.0  Persistent nausea and vomiting  Severe uncontrolled pain  Redness, tenderness, or signs of infection (pain, swelling, redness, odor or green/yellow discharge around the site)  Difficulty breathing, headache or visual disturbances  Hives  Persistent dizziness or light-headedness  Any other questions or concerns you may have after discharge  In an emergency, call 911 or go to an Emergency Department at a nearby hospital.  Diet: Begin with liquids, and if they are tolerated, resume your usual diet.  Avoid spicy, greasy or heavy foods.  If you have nausea or vomiting, go back to liquids.  If you cannot keep liquids down, call your doctor.  Avoid alcohol consumption while on prescription pain medications. Good nutrition promotes healing. Increase fiber and fluids.   ADDITIONAL INSTRUCTIONS: Leave Dermabond in place 7-10 days.  May shower.  Tylenol or Advil as needed for pain.  Ice pack for 48 hours and as needed.  [facility] Office: [phone]  No acetaminophen/Tylenol until after 1:30pm today if needed for pain.       Post Anesthesia Home Care Instructions  Activity: Get plenty of rest for the remainder of the day. A responsible individual must stay with you for 24 hours following the procedure.  For the next 24 hours, DO NOT: -Drive a car -Advertising copywriter -Drink alcoholic beverages -Take  any medication unless instructed by your physician -Make any legal decisions or sign important papers.  Meals: Start with liquid foods such as gelatin or soup. Progress to regular foods as tolerated. Avoid greasy, spicy, heavy foods. If nausea and/or vomiting occur, drink only clear liquids until the nausea and/or vomiting subsides. Call your physician if vomiting continues.  Special Instructions/Symptoms: Your throat may feel dry or sore from the anesthesia or the breathing tube placed in your throat during surgery. If this causes discomfort, gargle with warm salt water. The discomfort should disappear within 24 hours.

## 2022-12-21 NOTE — Interval H&P Note (Signed)
History and Physical Interval Note:  12/21/2022 8:41 AM  Diane Erickson  has presented today for surgery, with the diagnosis of HISTORY OF COLON CANCER.  The various methods of treatment have been discussed with the patient and family. After consideration of risks, benefits and other options for treatment, the patient has consented to    Procedure(s) with comments: REMOVAL OF INFUSION PORT (N/A) - LMA as a surgical intervention.    The patient's history has been reviewed, patient examined, no change in status, stable for surgery.  I have reviewed the patient's chart and labs.  Questions were answered to the patient's satisfaction.    Darnell Level, MD [facility] practice Office: [phone]   Darnell Level

## 2022-12-21 NOTE — Anesthesia Procedure Notes (Signed)
Procedure Name: LMA Insertion Date/Time: 12/21/2022 8:54 AM  Performed by: Dairl Ponder, CRNAPre-anesthesia Checklist: Patient identified, Emergency Drugs available, Suction available and Patient being monitored Patient Re-evaluated:Patient Re-evaluated prior to induction Oxygen Delivery Method: Circle System Utilized Preoxygenation: Pre-oxygenation with 100% oxygen Induction Type: IV induction Ventilation: Mask ventilation without difficulty LMA: LMA inserted LMA Size: 4.0 Number of attempts: 1 Airway Equipment and Method: Bite block Placement Confirmation: positive ETCO2 Tube secured with: Tape Dental Injury: Teeth and Oropharynx as per pre-operative assessment

## 2022-12-21 NOTE — Op Note (Signed)
Operative Note  Pre-operative Diagnosis:  history of colon cancer, infusion port in place  Post-operative Diagnosis:  same  Surgeon:  Darnell Level, MD  Assistant:  none   Procedure:  removal of infusion port  Anesthesia:  general (LMA)  Estimated Blood Loss:  5 cc  Drains: none         Specimen: port removed  Indications:  Patient is referred for removal of infusion port. Patient has not been seen in my practice in many many years. Patient had undergone an open laparotomy with partial colectomy, distal pancreatectomy, and splenectomy for a locally advanced carcinoma of the colon. Infusion port was subsequently placed for adjuvant chemotherapy. Patient had been lost to follow-up for many years. She now presents on referral by her primary care physician for removal of her infusion port. This has not been used for many years. Patient is currently doing well. She has no complaints. She desires infusion port removal.   Procedure:  The patient was seen in the pre-op holding area. The risks, benefits, complications, treatment options, and expected outcomes were previously discussed with the patient. The patient agreed with the proposed plan and has signed the informed consent form.  The patient was brought to the operating room by the surgical team, identified as Diane Erickson and the procedure verified. A "time out" was completed and the above information confirmed.  Following induction of general anesthesia, the patient is positioned and then prepped and draped in usual aseptic fashion.  After ascertaining that an adequate level of anesthesia been achieved, the previous surgical incision site in the upper left chest wall was anesthetized with local anesthetic.  Incision is made through the old scar with a #15 blade.  Dissection was carried down until the port is identified.  The fibrous sheath around the port is opened and the port is freed from its attachments.  Previously placed suture material is  extracted.  The fibrous sheath around the catheter is incised and the port is withdrawn from the subcutaneous pocket.  Retraction on the catheter allows for approximately 6 to 8 cm of the catheter to be withdrawn.  However the remainder of the catheter appears to be tethered.  With firm traction the catheter will not advance out.  A counterincision is made near the left clavicle.  Dissection is carried through subcutaneous tissues and the catheter is identified.  It is grasped with a hemostat.  Again the fibrous sheath around the catheter is incised.  With firm retraction the catheter is eventually completely removed in its entirety.  The port and catheter are removed from the subcutaneous tissues and passed off the field.  Local anesthetic is infiltrated at each incision site.  Good hemostasis is achieved with the electrocautery.  Subcutaneous tissues are closed with interrupted 3-0 Vicryl sutures.  Skin is closed with a running 4-0 Monocryl subcuticular suture.  Wounds are washed and dried and Dermabond was applied as dressing.  Patient is awakened from anesthesia and transported to the recovery room.  The patient tolerated the procedure well.   Darnell Level, MD [facility] Office: [phone]

## 2022-12-21 NOTE — Anesthesia Postprocedure Evaluation (Signed)
Anesthesia Post Note  Patient: Diane Erickson  Procedure(s) Performed: REMOVAL OF INFUSION PORT (Chest)     Patient location during evaluation: PACU Anesthesia Type: General Level of consciousness: awake and alert Pain management: pain level controlled Vital Signs Assessment: post-procedure vital signs reviewed and stable Respiratory status: spontaneous breathing, nonlabored ventilation, respiratory function stable and patient connected to nasal cannula oxygen Cardiovascular status: blood pressure returned to baseline and stable Postop Assessment: no apparent nausea or vomiting Anesthetic complications: no  No notable events documented.  Last Vitals:  Vitals:   12/21/22 1015 12/21/22 1044  BP: 132/82 (!) 148/86  Pulse: 69 72  Resp: 16 16  Temp: (!) 36.3 C (!) 36.4 C  SpO2: 100% 98%    Last Pain:  Vitals:   12/21/22 1044  TempSrc: Oral  PainSc: 0-No pain                  L 

## 2022-12-21 NOTE — Progress Notes (Signed)
Gait steady, req min assistance, denies any pain or discomfort at this time. Utilizing ice pack for comfort

## 2022-12-22 ENCOUNTER — Encounter (HOSPITAL_BASED_OUTPATIENT_CLINIC_OR_DEPARTMENT_OTHER): Payer: Self-pay | Admitting: Surgery

## 2023-06-04 ENCOUNTER — Ambulatory Visit (HOSPITAL_COMMUNITY): Payer: Medicaid Other | Attending: Internal Medicine

## 2023-07-13 ENCOUNTER — Ambulatory Visit: Payer: Self-pay

## 2023-07-13 NOTE — Telephone Encounter (Signed)
 Copied from CRM [id number]. Topic: Clinical - Red Word Triage >> Jul 13, 2023 11:09 AM Antony Haste wrote: Red Word that prompted transfer to Nurse Triage: Patient is reporting that she experiences numbness/tingling in her feet and hands.   Chief Complaint: Numbness and tingling  Symptoms: Numbness and tingling of hands and feet  Frequency: Intermittent  Pertinent Negatives: Patient denies any other complaint  Disposition: [] ED /[x] Urgent Care (no appt availability in office) / [] Appointment(In office/virtual)/ []  [facility]/ [] Home Care/ [] Refused Recommended Disposition /[]  Mobile Bus/ []  Follow-up with PCP Additional Notes: Patient reports history of numbness and tingling that's been present for over a year. She states that her symptoms are intermittent and recently came back. She reports that she would like an appointment to be seen for it. I advised her of the earliest new patient appointment at her preferred office. Patient agreeable with waiting until then and states she will go to urgent care or the ED if her symptoms worsen. New patient appointment added to wait list.     Reason for Disposition  [1] Numbness or tingling in one or both hands AND [2] is a chronic symptom (recurrent or ongoing AND present > 4 weeks)  Answer Assessment - Initial Assessment Questions 1. SYMPTOM: "What is the main symptom you are concerned about?" (e.g., weakness, numbness)     Tingling in hands and feet 2. ONSET: "When did this start?" (minutes, hours, days; while sleeping)     Chronic problem  4. PATTERN "Does this come and go, or has it been constant since it started?"  "Is it present now?"     Intermittent 5. CARDIAC SYMPTOMS: "Have you had any of the following symptoms: chest pain, difficulty breathing, palpitations?"     No 6. NEUROLOGIC SYMPTOMS: "Have you had any of the following symptoms: headache, dizziness, vision loss, double vision, changes in speech, unsteady on your  feet?"     No 7. OTHER SYMPTOMS: "Do you have any other symptoms?"     No 8. PREGNANCY: "Is there any chance you are pregnant?" "When was your last menstrual period?"     No  Protocols used: Neurologic Deficit-A-AH

## 2023-07-30 NOTE — Telephone Encounter (Addendum)
 Copied from CRM [id number]. Topic: General - Other >> Jul 30, 2023  9:40 AM Hamdi H wrote: Reason for CRM: Patients prior authorization expired in Feb 6th. Pt has an Mri scheduled for Wednesday March 26. We need a new prior authorization filled out before the appointment, otherwise the appointment will need to be cancelled or rescheduled. Dickie La is the referring provider.  This pt has now transferred her care from the [facility].  She has not seen our office since 09/06/2022.  A new authorization can no longer be obtained if we have not seen the patient in over a year.  She is now with Provider  Guadalupe Maple, FNP Family Medicine, NP NPI: [id number] [place] [address]   Phone: [phone] Fax: [phone]   She is sch on the following day and should f/u with  this order during her Sch appointment on 08/02/2023.

## 2023-08-01 ENCOUNTER — Ambulatory Visit (HOSPITAL_COMMUNITY)

## 2023-08-03 ENCOUNTER — Other Ambulatory Visit: Payer: Self-pay | Admitting: Nurse Practitioner

## 2023-08-03 DIAGNOSIS — K769 Liver disease, unspecified: Secondary | ICD-10-CM

## 2023-08-17 ENCOUNTER — Other Ambulatory Visit: Payer: Self-pay | Admitting: Nurse Practitioner

## 2023-08-17 DIAGNOSIS — Z1231 Encounter for screening mammogram for malignant neoplasm of breast: Secondary | ICD-10-CM

## 2023-08-22 LAB — GLUCOSE, POCT (MANUAL RESULT ENTRY): Glucose Fasting, POC: 97 mg/dL (ref 70–99)

## 2023-08-22 NOTE — Progress Notes (Signed)
 157/94 155/96 fasting glucose 97. Patient came to [facility] to check blood pressure and glucose. Pt stated her BP had been on a up and down cycle. She does check her BP at home regularly. This morning was rated 128/72. Patient stated she had been doing a lot of moving around to complete errands prior to screening. Patient has Dr appt. Scheduled for April 24. We discussed hypertension as well as resources for transportation and food SDOH. Pt will continue to monitor blood pressure and will follow up with PCP.

## 2023-08-24 ENCOUNTER — Ambulatory Visit
Admission: RE | Admit: 2023-08-24 | Discharge: 2023-08-24 | Disposition: A | Discharge status: Home / Self Care | Source: Ambulatory Visit | Attending: Nurse Practitioner | Admitting: Nurse Practitioner

## 2023-08-24 DIAGNOSIS — Z1231 Encounter for screening mammogram for malignant neoplasm of breast: Secondary | ICD-10-CM

## 2023-09-03 ENCOUNTER — Ambulatory Visit (HOSPITAL_COMMUNITY)

## 2023-10-06 ENCOUNTER — Other Ambulatory Visit

## 2023-10-22 ENCOUNTER — Encounter: Payer: Self-pay | Admitting: Nurse Practitioner

## 2023-10-23 ENCOUNTER — Ambulatory Visit
Admission: RE | Admit: 2023-10-23 | Discharge: 2023-10-23 | Discharge status: Home / Self Care | Source: Ambulatory Visit | Attending: Nurse Practitioner | Admitting: Nurse Practitioner

## 2023-10-23 ENCOUNTER — Ambulatory Visit: Payer: Self-pay | Admitting: Student

## 2023-10-23 DIAGNOSIS — K769 Liver disease, unspecified: Secondary | ICD-10-CM

## 2023-10-23 MED ORDER — GADOPICLENOL 0.5 MMOL/ML IV SOLN
7.0000 mL | Freq: Once | INTRAVENOUS | Status: AC | PRN
  Administered 2023-10-23: 7 mL via INTRAVENOUS

## 2023-11-06 NOTE — Progress Notes (Signed)
 Pt attended 08/22/2023 screening event with BP of 155/96 and blood sugar was 97. Pt noted at event that she does have a PCP. At event pt did indicate food & transportation SDOH needs. Pt also noted that she is not a smoker and listed Medicaid as her insurance at the event.   Per initial f/u pt was reached out via phone (11/06/2023; 11/12/2023) and pt stated that she would like to be sent resources in regards to getting a bus pass and financial assistance with bus pass. Pt was mailed letter with bus transportation resources (for [employer] external programs), food resources, & [place] [address] Card.  Per chart review pt does have a PCP Valeda Daring; [facility]), insurance, and is a former smoker. Pt's last app with PCP was 10/04/2023. Pt does not indicate any SDOH needs at this time.  Additional pt f/u to be scheduled at this time per health equity protocol.

## 2023-11-19 ENCOUNTER — Ambulatory Visit

## 2023-11-19 VITALS — BP 138/89 | HR 90 | Ht 64.0 in | Wt 145.6 lb

## 2023-11-19 DIAGNOSIS — I10 Essential (primary) hypertension: Secondary | ICD-10-CM | POA: Diagnosis not present

## 2023-11-19 DIAGNOSIS — M79601 Pain in right arm: Secondary | ICD-10-CM | POA: Diagnosis present

## 2023-11-19 MED ORDER — IBUPROFEN 200 MG PO TABS: 600.0000 mg | ORAL_TABLET | Freq: Three times a day (TID) | ORAL | 2 refills | Status: AC | PRN
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
## 2023-11-19 NOTE — Assessment & Plan Note (Addendum)
 Patient is currently followed by an outside primary care provider and was recently started on losartan 25 mg daily (She did not take med today).  She reports that her blood pressure is well controlled at home and denies headache, vision changes, or chest pain. Lungs are clear to auscultation bilaterally. Heart exam reveals regular rate and rhythm with no murmurs.Lungs are clear to auscultation bilaterally. Heart exam reveals regular rate and rhythm with no murmurs.We discussed her decision regarding PCP continuity, and she will inform us  once she decides whether to continue with her current provider or transition care to our clinic.
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
## 2023-11-19 NOTE — Patient Instructions (Signed)
 Dear Ms. Angelyn Kirks,  Thank you for allowing us  to care for you today.  During our visit, we discussed the ongoing pain in your arm that has been bothering you since your fall about a month ago. To better understand the cause of your discomfort, we have ordered radiology imaging of your right arm to check for any possible fractures. In the meantime, we have also started you on a pain medication to help relieve your symptoms.  Please don't hesitate to reach out if you have any questions or need further assistance. We're here to support you.  I have ordered the following labs for you:  Lab Orders  No laboratory test(s) ordered today     Tests ordered today:    Referrals ordered today:   Referral Orders  No referral(s) requested today     I have ordered the following medication/changed the following medications:   Stop the following medications: There are no discontinued medications.   Start the following medications: Meds ordered this encounter  Medications   ibuprofen  (ADVIL ) 200 MG tablet    Sig: Take 3 tablets (600 mg total) by mouth every 8 (eight) hours as needed.    Dispense:  100 tablet    Refill:  2     Follow up: PRN   Remember:   Should you have any questions or concerns please call the internal medicine clinic at [phone].   Armando Rossetti M.D [facility]
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
## 2024-04-09 ENCOUNTER — Encounter: Payer: Self-pay | Admitting: *Deleted

## 2024-04-09 NOTE — Progress Notes (Signed)
 Pt attended 08/22/23 screening event where her bp was 155/96 and her blood sugar was 97. At the event, the pt shared her PCP was at [facility], that she did have Medicaid, did not smoke and had food and transportation insecurities for which she was given resources at the event. Event nurse also noted pt had been rushing before getting b/p taken at the event and advised her to f/u with her PCP on 08/30/23 appt.  During the initial event f/u by health equity team, pt requested inf about bus passes, which was mailed to her , as well as additional food resources and [place] [address] card. During current 6 month post event f/u, chart review indicates pt did indeed f/u with her PCP Delorise Daring, NP at [facility] on 08/30/23 and again on 10/04/2023, where her b/p was 140/80 on manual recheck.   Health equity team member unable to contact pt by phone but noted pt has Medicaid insurance that offers transportation to appt so final letter mailed to pt with Medicaid transportation phone number as well as food resources once again.Pt also advised via letter to please let PCP know if she has ongoing transportation and/or food support needs as her PCP's office also has access to resources for her as [facility] a psychiatric nurse center. No additional health equity team support scheduled at this time.
